# Patient Record
Sex: Female | Born: 1985 | Race: Black or African American | Hispanic: No | Marital: Single | Smoking: Heavy tobacco smoker
Health system: Southern US, Community
[De-identification: ages and names within clinical notes are randomized; demographics above are authoritative.]

## PROBLEM LIST (undated history)

## (undated) DIAGNOSIS — I10 Essential (primary) hypertension: Secondary | ICD-10-CM

---
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
## 2007-01-31 ENCOUNTER — Emergency Department (HOSPITAL_COMMUNITY): Admission: EM | Admit: 2007-01-31 | Discharge: 2007-01-31 | Payer: Self-pay | Admitting: Emergency Medicine

## 2007-02-01 ENCOUNTER — Emergency Department (HOSPITAL_COMMUNITY): Admission: EM | Admit: 2007-02-01 | Discharge: 2007-02-01 | Payer: Self-pay | Admitting: Emergency Medicine
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
## 2011-01-02 ENCOUNTER — Emergency Department (HOSPITAL_COMMUNITY)
Admission: EM | Admit: 2011-01-02 | Discharge: 2011-01-02 | Disposition: A | Discharge status: Home / Self Care | Payer: Self-pay | Attending: Emergency Medicine | Admitting: Emergency Medicine

## 2011-01-02 DIAGNOSIS — H5710 Ocular pain, unspecified eye: Secondary | ICD-10-CM | POA: Insufficient documentation

## 2011-01-02 DIAGNOSIS — H109 Unspecified conjunctivitis: Secondary | ICD-10-CM | POA: Insufficient documentation

## 2013-06-11 ENCOUNTER — Emergency Department (INDEPENDENT_AMBULATORY_CARE_PROVIDER_SITE_OTHER)
Admission: EM | Admit: 2013-06-11 | Discharge: 2013-06-11 | Disposition: A | Discharge status: Home / Self Care | Payer: Self-pay | Source: Home / Self Care | Attending: Family Medicine | Admitting: Family Medicine

## 2013-06-11 ENCOUNTER — Other Ambulatory Visit (HOSPITAL_COMMUNITY)
Admission: RE | Admit: 2013-06-11 | Discharge: 2013-06-11 | Disposition: A | Discharge status: Home / Self Care | Payer: Self-pay | Source: Ambulatory Visit | Attending: Family Medicine | Admitting: Family Medicine

## 2013-06-11 ENCOUNTER — Encounter (HOSPITAL_COMMUNITY): Payer: Self-pay | Admitting: Emergency Medicine

## 2013-06-11 DIAGNOSIS — N760 Acute vaginitis: Secondary | ICD-10-CM | POA: Insufficient documentation

## 2013-06-11 DIAGNOSIS — N898 Other specified noninflammatory disorders of vagina: Secondary | ICD-10-CM

## 2013-06-11 DIAGNOSIS — Z113 Encounter for screening for infections with a predominantly sexual mode of transmission: Secondary | ICD-10-CM | POA: Insufficient documentation

## 2013-06-11 DIAGNOSIS — Z202 Contact with and (suspected) exposure to infections with a predominantly sexual mode of transmission: Secondary | ICD-10-CM

## 2013-06-11 MED ORDER — AZITHROMYCIN 250 MG PO TABS
1000.0000 mg | ORAL_TABLET | Freq: Once | ORAL | Status: AC
  Administered 2013-06-11: 1000 mg via ORAL

## 2013-06-11 MED ORDER — LIDOCAINE HCL (PF) 1 % IJ SOLN
INTRAMUSCULAR | Status: AC
  Filled 2013-06-11: qty 5

## 2013-06-11 MED ORDER — CEFTRIAXONE SODIUM 250 MG IJ SOLR
250.0000 mg | Freq: Once | INTRAMUSCULAR | Status: AC
  Administered 2013-06-11: 250 mg via INTRAMUSCULAR

## 2013-06-11 MED ORDER — CEFTRIAXONE SODIUM 250 MG IJ SOLR
INTRAMUSCULAR | Status: AC
  Filled 2013-06-11: qty 250

## 2013-06-11 MED ORDER — AZITHROMYCIN 250 MG PO TABS
ORAL_TABLET | ORAL | Status: AC
  Filled 2013-06-11: qty 4

## 2013-06-11 NOTE — ED Provider Notes (Signed)
  CSN: [id number]     Arrival date & time 06/11/13  4098 History     First MD Initiated Contact with Patient 06/11/13 1011     Chief Complaint  Patient presents with  . Exposure to STD   (Consider location/radiation/quality/duration/timing/severity/associated sxs/prior Treatment) HPI Comments: 27 year old female here complaining of vaginal discharge and concerns about exposure to sexually transmitted disease. Reports that one of her sexual partners started to develop painful urination and discharge from penis and inform her of that this morning. Patient denies pelvic pain states that she had experienced vaginal discharge that is not different from what is usual for her. Denies dysuria or urinary frequency. No skin rashes. No mucosal ulcers. Patient is otherwise reporting feeling well.   History reviewed. No pertinent past medical history. History reviewed. No pertinent past surgical history. No family history on file. History  Substance Use Topics  . Smoking status: Heavy Tobacco Smoker -- 0.50 packs/day for 5 years    Types: Cigarettes  . Smokeless tobacco: Not on file  . Alcohol Use: No   OB History   Grav Para Term Preterm Abortions TAB SAB Ect Mult Living                 Review of Systems  Constitutional: Negative for fever, chills, appetite change and fatigue.  Gastrointestinal: Negative for nausea, vomiting, abdominal pain and diarrhea.  Genitourinary: Negative for dysuria, frequency, flank pain, vaginal bleeding, vaginal discharge and pelvic pain.  Skin: Negative for rash.  All other systems reviewed and are negative.    Allergies  Review of patient's allergies indicates not on file.  Home Medications  No current outpatient prescriptions on file. BP 141/87  Pulse 77  Temp(Src) 98.6 F (37 C) (Oral)  Resp 16  LMP 05/28/2013 Physical Exam  Nursing note and vitals reviewed. Constitutional: She is oriented to person, place, and time. She appears well-developed and  well-nourished. No distress.  HENT:  Head: Normocephalic and atraumatic.  Mouth/Throat: Oropharynx is clear and moist.  Eyes: No scleral icterus.  Cardiovascular: Normal heart sounds.   Pulmonary/Chest: Breath sounds normal.  Abdominal: Soft. There is no tenderness. There is no rebound and no guarding.  Genitourinary: Uterus normal. Cervix exhibits discharge. Cervix exhibits no motion tenderness and no friability. Right adnexum displays no mass, no tenderness and no fullness. Left adnexum displays no mass, no tenderness and no fullness. Vaginal discharge found.  Lymphadenopathy:    She has no cervical adenopathy.       Right: No inguinal adenopathy present.       Left: No inguinal adenopathy present.  Neurological: She is alert and oriented to person, place, and time.  Skin: No rash noted. She is not diaphoretic.    ED Course   Procedures (including critical care time)  Labs Reviewed  CERVICOVAGINAL ANCILLARY ONLY   No results found. 1. Exposure to STD   2. Vaginal discharge     MDM  Treated with Rocephin IM 250 mg x1 and azithromycin 1 g oral x1. GC Chlamydia and affirm test pending at the time of discharge. Recommended consistent condom use. Supportive care and red flags that should prompt return to medical attention discussed with patient and provided in writing  Sharin Grave, MD 06/12/13 1046

## 2013-06-11 NOTE — ED Notes (Signed)
Pt is here with her sexual partner that has discharge. Pt states she has no sxs, no pain, discharge.

## 2013-06-12 ENCOUNTER — Telehealth (HOSPITAL_COMMUNITY): Payer: Self-pay | Admitting: *Deleted

## 2013-06-12 MED ORDER — FLUCONAZOLE 150 MG PO TABS: ORAL_TABLET | ORAL | Status: DC

## 2013-06-12 MED ORDER — METRONIDAZOLE 500 MG PO TABS: 500.0000 mg | ORAL_TABLET | Freq: Two times a day (BID) | ORAL | Status: DC

## 2013-06-12 NOTE — ED Notes (Signed)
GC and Chlamydia pos., Affirm: Candida and Gardnerella pos., Trich neg.  Orders obtained from Dr. Tressia Danas.   I called pt. Pt. verified x 2 and given results.  Pt. told she was adequately treated for GC and Chlamydia with Rocephin and Zithromax and that she needs Diflucan for Candida and Flagyl for bacterial vaginosis.   Pt. instructed to no alcohol while taking this medication.  Pt. wants Rx. called to [facility] on [address]  Pt. instructed to notify her partner, no sex for 1 week and to practice safe sex. Pt. told she can get HIV testing at the [facility]. STD clinic, by appointment.  Rx. called to pharmacist @ [phone]. Michele Collins 06/12/2013

## 2013-06-13 NOTE — ED Notes (Signed)
[employer] forms x 2 completed and faxed to the [facility]. Michele Collins 06/13/2013

## 2014-05-02 ENCOUNTER — Ambulatory Visit: Payer: Self-pay | Admitting: Obstetrics

## 2022-07-20 ENCOUNTER — Emergency Department (HOSPITAL_COMMUNITY): Payer: Self-pay

## 2022-07-20 ENCOUNTER — Inpatient Hospital Stay (HOSPITAL_COMMUNITY)
Admission: EM | Admit: 2022-07-20 | Discharge: 2022-07-21 | DRG: 189 | Discharge status: Against Medical Advice | Payer: Self-pay | Attending: Internal Medicine | Admitting: Internal Medicine

## 2022-07-20 ENCOUNTER — Other Ambulatory Visit: Payer: Self-pay

## 2022-07-20 ENCOUNTER — Encounter (HOSPITAL_COMMUNITY): Payer: Self-pay

## 2022-07-20 DIAGNOSIS — F17210 Nicotine dependence, cigarettes, uncomplicated: Secondary | ICD-10-CM | POA: Diagnosis present

## 2022-07-20 DIAGNOSIS — J9690 Respiratory failure, unspecified, unspecified whether with hypoxia or hypercapnia: Secondary | ICD-10-CM | POA: Diagnosis present

## 2022-07-20 DIAGNOSIS — F17200 Nicotine dependence, unspecified, uncomplicated: Secondary | ICD-10-CM

## 2022-07-20 DIAGNOSIS — J9601 Acute respiratory failure with hypoxia: Principal | ICD-10-CM | POA: Diagnosis present

## 2022-07-20 DIAGNOSIS — Z1152 Encounter for screening for COVID-19: Secondary | ICD-10-CM

## 2022-07-20 DIAGNOSIS — F1410 Cocaine abuse, uncomplicated: Secondary | ICD-10-CM

## 2022-07-20 DIAGNOSIS — D509 Iron deficiency anemia, unspecified: Secondary | ICD-10-CM

## 2022-07-20 DIAGNOSIS — Z720 Tobacco use: Secondary | ICD-10-CM

## 2022-07-20 DIAGNOSIS — J3089 Other allergic rhinitis: Secondary | ICD-10-CM

## 2022-07-20 DIAGNOSIS — Z603 Acculturation difficulty: Secondary | ICD-10-CM | POA: Diagnosis present

## 2022-07-20 DIAGNOSIS — D649 Anemia, unspecified: Secondary | ICD-10-CM

## 2022-07-20 DIAGNOSIS — Z66 Do not resuscitate: Secondary | ICD-10-CM | POA: Diagnosis present

## 2022-07-20 DIAGNOSIS — Z5329 Procedure and treatment not carried out because of patient's decision for other reasons: Secondary | ICD-10-CM | POA: Diagnosis not present

## 2022-07-20 DIAGNOSIS — R911 Solitary pulmonary nodule: Secondary | ICD-10-CM | POA: Diagnosis present

## 2022-07-20 DIAGNOSIS — J4551 Severe persistent asthma with (acute) exacerbation: Secondary | ICD-10-CM | POA: Diagnosis present

## 2022-07-20 DIAGNOSIS — R0602 Shortness of breath: Secondary | ICD-10-CM

## 2022-07-20 LAB — BASIC METABOLIC PANEL
Anion gap: 8 (ref 5–15)
BUN: 9 mg/dL (ref 6–20)
CO2: 21 mmol/L — ABNORMAL LOW (ref 22–32)
Calcium: 8.9 mg/dL (ref 8.9–10.3)
Chloride: 108 mmol/L (ref 98–111)
Creatinine, Ser: 0.66 mg/dL (ref 0.44–1.00)
GFR, Estimated: >60 mL/min (ref >60)
Glucose, Bld: 93 mg/dL (ref 70–99)
Potassium: 3.5 mmol/L (ref 3.5–5.1)
Sodium: 137 mmol/L (ref 135–145)

## 2022-07-20 LAB — CBC
HCT: 31 % — ABNORMAL LOW (ref 36.0–46.0)
Hemoglobin: 9 g/dL — ABNORMAL LOW (ref 12.0–15.0)
MCH: 22.4 pg — ABNORMAL LOW (ref 26.0–34.0)
MCHC: 29 g/dL — ABNORMAL LOW (ref 30.0–36.0)
MCV: 77.1 fL — ABNORMAL LOW (ref 80.0–100.0)
Platelets: 277 10*3/uL (ref 150–400)
RBC: 4.02 MIL/uL (ref 3.87–5.11)
RDW: 17.5 % — ABNORMAL HIGH (ref 11.5–15.5)
WBC: 7.3 10*3/uL (ref 4.0–10.5)
nRBC: 0 % (ref 0.0–0.2)

## 2022-07-20 LAB — TROPONIN I (HIGH SENSITIVITY)
Troponin I (High Sensitivity): 3 ng/L (ref <18)
Troponin I (High Sensitivity): 2 ng/L (ref <18)

## 2022-07-20 LAB — FOLATE: Folate: 12 ng/mL (ref >5.9)

## 2022-07-20 LAB — RESP PANEL BY RT-PCR (FLU A&B, COVID) ARPGX2
Influenza A by PCR: NEGATIVE
Influenza B by PCR: NEGATIVE
SARS Coronavirus 2 by RT PCR: NEGATIVE

## 2022-07-20 LAB — RETICULOCYTES
Immature Retic Fract: 15.3 % (ref 2.3–15.9)
RBC.: 3.64 MIL/uL — ABNORMAL LOW (ref 3.87–5.11)
Retic Count, Absolute: 33.9 10*3/uL (ref 19.0–186.0)
Retic Ct Pct: 0.9 % (ref 0.4–3.1)

## 2022-07-20 LAB — VITAMIN B12: Vitamin B-12: 223 pg/mL (ref 180–914)

## 2022-07-20 LAB — FERRITIN: Ferritin: 8 ng/mL — ABNORMAL LOW (ref 11–307)

## 2022-07-20 LAB — POC OCCULT BLOOD, ED: Fecal Occult Bld: NEGATIVE

## 2022-07-20 LAB — TSH: TSH: 0.653 u[IU]/mL (ref 0.350–4.500)

## 2022-07-20 LAB — BRAIN NATRIURETIC PEPTIDE: B Natriuretic Peptide: 42 pg/mL (ref 0.0–100.0)

## 2022-07-20 LAB — IRON AND TIBC
Iron: 20 ug/dL — ABNORMAL LOW (ref 28–170)
Saturation Ratios: 4 % — ABNORMAL LOW (ref 10.4–31.8)
TIBC: 461 ug/dL — ABNORMAL HIGH (ref 250–450)
UIBC: 441 ug/dL

## 2022-07-20 LAB — LACTATE DEHYDROGENASE: LDH: 107 U/L (ref 98–192)

## 2022-07-20 LAB — MAGNESIUM: Magnesium: 1.7 mg/dL (ref 1.7–2.4)

## 2022-07-20 LAB — HCG, QUANTITATIVE, PREGNANCY: hCG, Beta Chain, Quant, S: <1 m[IU]/mL (ref <5)

## 2022-07-20 MED ORDER — MAGNESIUM SULFATE 2 GM/50ML IV SOLN
2.0000 g | Freq: Once | INTRAVENOUS | Status: AC
  Administered 2022-07-20: 2 g via INTRAVENOUS
  Filled 2022-07-20: qty 50

## 2022-07-20 MED ORDER — PREDNISONE 20 MG PO TABS: 40.0000 mg | ORAL_TABLET | Freq: Every day | ORAL | Status: DC

## 2022-07-20 MED ORDER — METHYLPREDNISOLONE SODIUM SUCC 125 MG IJ SOLR
125.0000 mg | Freq: Once | INTRAMUSCULAR | Status: AC
  Administered 2022-07-20: 125 mg via INTRAVENOUS
  Filled 2022-07-20: qty 2

## 2022-07-20 MED ORDER — ENOXAPARIN SODIUM 40 MG/0.4ML IJ SOSY
40.0000 mg | PREFILLED_SYRINGE | INTRAMUSCULAR | Status: DC
  Administered 2022-07-21: 40 mg via SUBCUTANEOUS
  Filled 2022-07-20: qty 0.4

## 2022-07-20 MED ORDER — IPRATROPIUM-ALBUTEROL 0.5-2.5 (3) MG/3ML IN SOLN: 3.0000 mL | Freq: Four times a day (QID) | RESPIRATORY_TRACT | Status: DC

## 2022-07-20 MED ORDER — FERROUS FUMARATE 324 (106 FE) MG PO TABS
1.0000 | ORAL_TABLET | Freq: Every day | ORAL | Status: DC
  Administered 2022-07-21: 106 mg via ORAL
  Filled 2022-07-20: qty 1

## 2022-07-20 MED ORDER — ACETAMINOPHEN 650 MG RE SUPP: 650.0000 mg | Freq: Four times a day (QID) | RECTAL | Status: DC | PRN

## 2022-07-20 MED ORDER — METHYLPREDNISOLONE SODIUM SUCC 40 MG IJ SOLR
40.0000 mg | Freq: Two times a day (BID) | INTRAMUSCULAR | Status: AC
  Administered 2022-07-20 – 2022-07-21 (×2): 40 mg via INTRAVENOUS
  Filled 2022-07-20 (×2): qty 1

## 2022-07-20 MED ORDER — IPRATROPIUM-ALBUTEROL 0.5-2.5 (3) MG/3ML IN SOLN
3.0000 mL | Freq: Four times a day (QID) | RESPIRATORY_TRACT | Status: DC
  Administered 2022-07-20 – 2022-07-21 (×4): 3 mL via RESPIRATORY_TRACT
  Filled 2022-07-20 (×4): qty 3

## 2022-07-20 MED ORDER — NICOTINE 14 MG/24HR TD PT24
14.0000 mg | MEDICATED_PATCH | Freq: Every day | TRANSDERMAL | Status: DC
  Administered 2022-07-20 – 2022-07-21 (×2): 14 mg via TRANSDERMAL
  Filled 2022-07-20 (×2): qty 1

## 2022-07-20 MED ORDER — ORAL CARE MOUTH RINSE: 15.0000 mL | OROMUCOSAL | Status: DC | PRN

## 2022-07-20 MED ORDER — CHLORHEXIDINE GLUCONATE CLOTH 2 % EX PADS: 6.0000 | MEDICATED_PAD | Freq: Every day | CUTANEOUS | Status: DC

## 2022-07-20 MED ORDER — GUAIFENESIN ER 600 MG PO TB12
600.0000 mg | ORAL_TABLET | Freq: Two times a day (BID) | ORAL | Status: DC
  Administered 2022-07-20 – 2022-07-21 (×2): 600 mg via ORAL
  Filled 2022-07-20 (×2): qty 1

## 2022-07-20 MED ORDER — SODIUM CHLORIDE (PF) 0.9 % IJ SOLN
INTRAMUSCULAR | Status: AC
  Filled 2022-07-20: qty 50

## 2022-07-20 MED ORDER — IOHEXOL 350 MG/ML SOLN
80.0000 mL | Freq: Once | INTRAVENOUS | Status: AC | PRN
  Administered 2022-07-20: 80 mL via INTRAVENOUS

## 2022-07-20 MED ORDER — ALBUTEROL SULFATE (2.5 MG/3ML) 0.083% IN NEBU
5.0000 mg/h | INHALATION_SOLUTION | Freq: Once | RESPIRATORY_TRACT | Status: AC
  Administered 2022-07-20: 5 mg/h via RESPIRATORY_TRACT
  Filled 2022-07-20: qty 6

## 2022-07-20 MED ORDER — IPRATROPIUM-ALBUTEROL 0.5-2.5 (3) MG/3ML IN SOLN
3.0000 mL | Freq: Once | RESPIRATORY_TRACT | Status: AC
  Administered 2022-07-20: 3 mL via RESPIRATORY_TRACT
  Filled 2022-07-20: qty 9

## 2022-07-20 MED ORDER — ORAL CARE MOUTH RINSE
15.0000 mL | OROMUCOSAL | Status: DC
  Administered 2022-07-21 (×3): 15 mL via OROMUCOSAL

## 2022-07-20 MED ORDER — ACETAMINOPHEN 325 MG PO TABS: 650.0000 mg | ORAL_TABLET | Freq: Four times a day (QID) | ORAL | Status: DC | PRN

## 2022-07-20 MED ORDER — IPRATROPIUM-ALBUTEROL 0.5-2.5 (3) MG/3ML IN SOLN
3.0000 mL | Freq: Once | RESPIRATORY_TRACT | Status: AC
  Administered 2022-07-20: 3 mL via RESPIRATORY_TRACT
  Filled 2022-07-20: qty 3
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
## 2022-07-20 NOTE — ED Notes (Signed)
Pt ambulated to restroom. 

## 2022-07-20 NOTE — ED Provider Notes (Signed)
[facility]-EMERGENCY DEPT Provider Note   CSN: [id number] Arrival date & time: 07/20/22  0133     History  Chief Complaint  Patient presents with   Shortness of Breath    Michele Collins is a 36 y.o. female.  Patient as above with significant medical history as below, including tobacco use, self reported asthma who presents to the ED with complaint of difficulty breathing.  Patient reports feeling unwell over the past week.  Sinus pressure, congestion, postnasal drip, productive cough with white/clear sputum intermittently.  No recent travel or sick contacts per no medications prior to arrival.  Began to have dyspnea this afternoon, progressively worsening.  Difficulty catching her breath. Diff speaking 2/2 dib.  Reports some subjective fevers, no chills.  No history of COPD.  No chest pain.  No recent travel or sick contacts.  No oral hormone use, no history of DVT or PE. Currently menstruating.       History reviewed. No pertinent past medical history.  History reviewed. No pertinent surgical history.   The history is provided by the patient. A language interpreter was used.  Shortness of Breath Associated symptoms: cough        Home Medications Prior to Admission medications   Medication Sig Start Date End Date Taking? Authorizing Provider  fluconazole (DIFLUCAN) 150 MG tablet 1 tab po x1 06/12/13   Moreno-Coll, Adlih, MD  metroNIDAZOLE (FLAGYL) 500 MG tablet Take 1 tablet (500 mg total) by mouth 2 (two) times daily. 06/12/13   Moreno-Coll, Adlih, MD      Allergies    Patient has no allergy information on record.    Review of Systems   Review of Systems  Unable to perform ROS: Severe respiratory distress  Constitutional:  Positive for fatigue.  HENT:  Positive for congestion, postnasal drip, rhinorrhea and sinus pressure.   Respiratory:  Positive for cough and shortness of breath.     Physical Exam Updated Vital Signs BP (!) 141/79   Pulse  (!) 104   Temp 98.9 F (37.2 C) (Oral)   Resp (!) 25   Ht 5\' 8"  (1.727 m)   Wt 72.6 kg   LMP  (LMP Unknown) Comment: negative beta HCG 07/20/22  SpO2 98%   BMI 24.33 kg/m  Physical Exam Vitals and nursing note reviewed.  Constitutional:      General: She is in acute distress.     Appearance: Normal appearance. She is well-developed. She is diaphoretic.  HENT:     Head: Normocephalic and atraumatic.     Jaw: There is normal jaw occlusion.     Right Ear: External ear normal.     Left Ear: External ear normal.     Nose: Nose normal.     Mouth/Throat:     Mouth: Mucous membranes are moist.     Pharynx: Uvula midline.  Eyes:     General: No scleral icterus.       Right eye: No discharge.        Left eye: No discharge.  Cardiovascular:     Rate and Rhythm: Normal rate and regular rhythm.     Pulses: Normal pulses.     Heart sounds: Normal heart sounds.  Pulmonary:     Effort: Tachypnea, accessory muscle usage and respiratory distress present.     Breath sounds: Decreased air movement present. Decreased breath sounds and wheezing present.  Abdominal:     General: Abdomen is flat.     Tenderness: There is  no abdominal tenderness.  Musculoskeletal:        General: Normal range of motion.     Cervical back: Normal range of motion.     Right lower leg: No edema.     Left lower leg: No edema.  Skin:    General: Skin is warm.     Capillary Refill: Capillary refill takes less than 2 seconds.  Neurological:     Mental Status: She is alert.  Psychiatric:        Mood and Affect: Mood normal.        Behavior: Behavior normal.     ED Results / Procedures / Treatments   Labs (all labs ordered are listed, but only abnormal results are displayed) Labs Reviewed  CBC - Abnormal; Notable for the following components:      Result Value   Hemoglobin 9.0 (*)    HCT 31.0 (*)    MCV 77.1 (*)    MCH 22.4 (*)    MCHC 29.0 (*)    RDW 17.5 (*)    All other components within normal  limits  BASIC METABOLIC PANEL - Abnormal; Notable for the following components:   CO2 21 (*)    All other components within normal limits  RETICULOCYTES - Abnormal; Notable for the following components:   RBC. 3.64 (*)    All other components within normal limits  RESP PANEL BY RT-PCR (FLU A&B, COVID) ARPGX2  MAGNESIUM  HCG, QUANTITATIVE, PREGNANCY  BRAIN NATRIURETIC PEPTIDE  LACTATE DEHYDROGENASE  OCCULT BLOOD X 1 CARD TO LAB, STOOL  TSH  VITAMIN B12  FOLATE  IRON AND TIBC  FERRITIN  POC OCCULT BLOOD, ED  TROPONIN I (HIGH SENSITIVITY)  TROPONIN I (HIGH SENSITIVITY)    EKG EKG Interpretation  Date/Time:  Tuesday July 20 2022 01:37:55 EDT Ventricular Rate:  105 PR Interval:  174 QRS Duration: 93 QT Interval:  347 QTC Calculation: 459 R Axis:   68 Text Interpretation: Sinus tachycardia Consider right atrial enlargement No old tracing to compare no stemi Interpretation limited secondary to artifact Confirmed by Tanda Rockers ([id number]) on 07/20/2022 6:55:20 AM  Radiology CT Angio Chest PE W and/or Wo Contrast  Result Date: 07/20/2022 CLINICAL DATA:  36 year old female with history of shortness of breath for the past 2 days. Evaluate for pulmonary embolism. EXAM: CT ANGIOGRAPHY CHEST WITH CONTRAST TECHNIQUE: Multidetector CT imaging of the chest was performed using the standard protocol during bolus administration of intravenous contrast. Multiplanar CT image reconstructions and MIPs were obtained to evaluate the vascular anatomy. RADIATION DOSE REDUCTION: This exam was performed according to the departmental dose-optimization program which includes automated exposure control, adjustment of the mA and/or kV according to patient size and/or use of iterative reconstruction technique. CONTRAST:  26mL OMNIPAQUE IOHEXOL 350 MG/ML SOLN COMPARISON:  No priors. FINDINGS: Comment: Today's study is limited by considerable patient respiratory motion. Cardiovascular: No central or lobar filling  defects are noted to suggest large pulmonary embolism. Smaller segmental and subsegmental sized emboli can not be entirely excluded on the basis of today's examination which is severely limited by a large amount of patient respiratory motion. Heart size is normal. There is no significant pericardial fluid, thickening or pericardial calcification. No atherosclerotic calcifications are noted in the thoracic aorta or the coronary arteries. Mediastinum/Nodes: No pathologically enlarged mediastinal or hilar lymph nodes. Esophagus is unremarkable in appearance. No axillary lymphadenopathy. Lungs/Pleura: Areas of mild subsegmental atelectasis or scarring are noted in the lung bases bilaterally, most severe in the right  middle lobe. No acute consolidative airspace disease. No pleural effusions. Tiny 3 mm pulmonary nodule in the periphery of the right middle lobe (axial image 85 of series 6). No other larger more suspicious appearing pulmonary nodules or masses are noted. Upper Abdomen: Unremarkable. Musculoskeletal: There are no aggressive appearing lytic or blastic lesions noted in the visualized portions of the skeleton. Review of the MIP images confirms the above findings. IMPRESSION: 1. Severely limited examination secondary to extensive patient respiratory motion. With these limitations in mind there is no central or lobar sized pulmonary embolism. Segmental and subsegmental sized emboli are not excluded on the basis of today's examination. 2. 3 mm right middle lobe pulmonary nodule. This is nonspecific, but statistically likely benign. No follow-up needed if patient is low-risk.This recommendation follows the consensus statement: Guidelines for Management of Incidental Pulmonary Nodules Detected on CT Images: From the Fleischner Society 2017; Radiology 2017; 284:228-243. Electronically Signed   By: Trudie Reed M.D.   On: 07/20/2022 05:11   DG Chest Portable 1 View  Result Date: 07/20/2022 CLINICAL DATA:   Increasing shortness of breath over the past 2 days EXAM: PORTABLE CHEST 1 VIEW COMPARISON:  01/31/2007 FINDINGS: The heart size and mediastinal contours are within normal limits. Both lungs are clear. The visualized skeletal structures are unremarkable. IMPRESSION: No active disease. Electronically Signed   By: Alcide Clever M.D.   On: 07/20/2022 02:24    Procedures .Critical Care  Performed by: Sloan Leiter, DO Authorized by: Sloan Leiter, DO   Critical care provider statement:    Critical care time (minutes):  49   Critical care time was exclusive of:  Separately billable procedures and treating other patients   Critical care was necessary to treat or prevent imminent or life-threatening deterioration of the following conditions:  Respiratory failure   Critical care was time spent personally by me on the following activities:  Development of treatment plan with patient or surrogate, discussions with consultants, evaluation of patient's response to treatment, examination of patient, ordering and review of laboratory studies, ordering and review of radiographic studies, ordering and performing treatments and interventions, pulse oximetry, re-evaluation of patient's condition, review of old charts and obtaining history from patient or surrogate   Care discussed with: admitting provider       Medications Ordered in ED Medications  sodium chloride (PF) 0.9 % injection (has no administration in time range)  ipratropium-albuterol (DUONEB) 0.5-2.5 (3) MG/3ML nebulizer solution 3 mL (3 mLs Nebulization Given 07/20/22 0142)  methylPREDNISolone sodium succinate (SOLU-MEDROL) 125 mg/2 mL injection 125 mg (125 mg Intravenous Given 07/20/22 0143)  ipratropium-albuterol (DUONEB) 0.5-2.5 (3) MG/3ML nebulizer solution 3 mL (3 mLs Nebulization Given 07/20/22 0219)  magnesium sulfate IVPB 2 g 50 mL (0 g Intravenous Stopped 07/20/22 0321)  albuterol (PROVENTIL) (2.5 MG/3ML) 0.083% nebulizer solution (5 mg/hr  Nebulization Given 07/20/22 0305)  iohexol (OMNIPAQUE) 350 MG/ML injection 80 mL (80 mLs Intravenous Contrast Given 07/20/22 0454)    ED Course/ Medical Decision Making/ A&P Clinical Course as of 07/20/22 0700  Tue Jul 20, 2022  0527 Patient seems to have ongoing dyspnea, tachypnea despite multiple nebulized breathing treatments, Solu-Medrol, mag sulfate, 1 hour Treatment.  At this time recommend admission for asthma exacerbation.  Patient agreeable. [SG]    Clinical Course User Index [SG] Sloan Leiter, DO                           Medical Decision Making  Amount and/or Complexity of Data Reviewed Labs: ordered. Radiology: ordered.  Risk Prescription drug management. Decision regarding hospitalization.   This patient presents to the ED with chief complaint(s) of dib with pertinent past medical history of above which further complicates the presenting complaint. The complaint involves an extensive differential diagnosis and also carries with it a high risk of complications and morbidity.    In my evaluation of this patient's dyspnea my DDx includes, but is not limited to, pneumonia, pulmonary embolism, pneumothorax, pulmonary edema, metabolic acidosis, asthma, COPD, cardiac cause, anemia, anxiety, etc.   . Serious etiologies were considered.   The initial plan is to give duoneb, screening labs, xr solumedrol mag   Additional history obtained: Additional history obtained from  na Records reviewed  prior ED visits, prior labs and imaging  Independent labs interpretation:  The following labs were independently interpreted:  Labs reviewed, hemoglobin 9.0.  She has no baseline available.  Patient denies any history of prior low blood counts but does report she has had pica for the past few years.  Was told the past that she should take iron supplements but never started.  She is also currently menstruating, feels her period is typical, usually very light.  Occult negative.  No  melena Trop negative, BNP not elevated, RVP negative  Independent visualization of imaging: - I independently visualized the following imaging with scope of interpretation limited to determining acute life threatening conditions related to emergency care: Chest x-ray, which revealed no acute process.  Ongoing dyspnea, obtain CT PE, no evidence of PE but severely motion degraded  Cardiac monitoring was reviewed and interpreted by myself which shows SR  Treatment and Reassessment: DuoNeb, Solu-Medrol, mag sulfate >> Mild improvement in respiratory status Repeated DuoNeb >> Continue to improve, still having conversational dyspnea Give CAT 5mg  >> modest improvement, ongoing conversational dyspnea   Consultation: - Consulted or discussed management/test interpretation w/ external professional: na  Consideration for admission or further workup: Admission was considered   36 year old female with history as above including daily tobacco use, asthma history per the patient the ED secondary to URI symptoms, dyspnea.  Patient conversational dyspnea on arrival, respiratory distress.  Diffuse wheezing.  Accessory muscle use.  Labs reviewed, she has anemia 9.0 hemoglobin, microcytic.  No baseline able.  Send anemia panel.  Hemoccult was negative.  He denies melena.  She just started menstruating in the last 48 hours, menses are light.  No other obvious sources of bleeding.  No history of colonoscopy. Send anemia panel.   Ongoing respiratory difficulty despite multiple nebulized breathing treatments, ongoing wheezing which has mildly improved.  Conversational dyspnea ongoing.  Requiring supp oxygen 2 L nasal cannula.  Recommend admission.  She is agreeable. Spoke with Dr Bridgett Larsson who accepts pt for admit.         Social Determinants of health: Counseled patient for approximately 3 minutes regarding smoking cessation. Discussed risks of smoking and how they applied and affected their visit here today.  Patient not ready to quit at this time, however will follow up with their primary doctor when they are.   CPT code: [phone]: intermediate counseling for smoking cessation   Social History   Tobacco Use   Smoking status: Heavy Smoker    Packs/day: 0.50    Years: 5.00    Total pack years: 2.50    Types: Cigarettes  Substance Use Topics   Alcohol use: No            Final Clinical Impression(s) /  ED Diagnoses Final diagnoses:  Severe persistent asthma with acute exacerbation  Anemia, unspecified type    Rx / DC Orders ED Discharge Orders     None         Sloan Leiter, DO 07/20/22 0700

## 2022-07-20 NOTE — ED Notes (Signed)
Pt resting in stretcher pt has increased work of breathing but maintaining o2 saturation above 90. Pt able to eat 50% of meal.

## 2022-07-20 NOTE — ED Notes (Signed)
Respiratory paged for cont. Neb application.

## 2022-07-20 NOTE — H&P (Signed)
History and Physical    Patient: Michele Collins VOZ:[id number] DOB: [birth year] DOA: 07/20/2022 DOS: the patient was seen and examined on 07/20/2022 PCP: Patient, No Pcp Per  Patient coming from: Home  Chief Complaint:  Chief Complaint  Patient presents with   Shortness of Breath   HPI: Michele MULLANE is a 36 y.o. female with medical history significant of tobacco abuse, cocaine abuse. Presenting with shortness of breath. Her symptoms started 3 days ago. She's had a non-productive cough. No fever, N/V/D, or sick contacts. She didn't try any medicines to help. She endorses ongoing tobacco and crack cocaine use. When her symptoms did not improve last night, she decided to come to the ED for assistance.    Review of Systems: As mentioned in the history of present illness. All other systems reviewed and are negative.  PMHx History reviewed. No pertinent past medical history.  PSHx History reviewed. No pertinent surgical history.  Social History:  reports that she has been smoking cigarettes. She has a 2.50 pack-year smoking history. She does not have any smokeless tobacco history on file. She reports that she does not drink alcohol. +Smoking crack cocaine 3x week.   No Known Allergies  FamHx History reviewed. No pertinent family history.  Prior to Admission medications   Medication Sig Start Date End Date Taking? Authorizing Provider  fluconazole (DIFLUCAN) 150 MG tablet 1 tab po x1 Patient not taking: Reported on 07/20/2022 06/12/13   Moreno-Coll, Adlih, MD  metroNIDAZOLE (FLAGYL) 500 MG tablet Take 1 tablet (500 mg total) by mouth 2 (two) times daily. Patient not taking: Reported on 07/20/2022 06/12/13   Randa Spike, MD    Physical Exam: Vitals:   07/20/22 0530 07/20/22 0600 07/20/22 0615 07/20/22 0700  BP: (!) 140/96 (!) 141/79  139/85  Pulse: (!) 116 (!) 104  (!) 119  Resp:  (!) 25  20  Temp:   98.9 F (37.2 C)   TempSrc:   Oral   SpO2: 94% 98%  98%  Weight:       Height:       General: 36 y.o. female resting in bed in NAD Eyes: PERRL, normal sclera ENMT: Nares patent w/o discharge, orophaynx clear, dentition normal, ears w/o discharge/lesions/ulcers Neck: Supple, trachea midline Cardiovascular: tachy, +S1, S2, no m/g/r, equal pulses throughout Respiratory: diffuse wheeze, course, increased WOB, but she is moving good air GI: BS+, NDNT, no masses noted, no organomegaly noted MSK: No e/c/c Neuro: A&O x 3, no focal deficits Psyc: Appropriate interaction and affect, calm/cooperative  Data Reviewed:  Results for orders placed or performed during the hospital encounter of 07/20/22 (from the past 24 hour(s))  CBC     Status: Abnormal   Collection Time: 07/20/22  1:35 AM  Result Value Ref Range   WBC 7.3 4.0 - 10.5 K/uL   RBC 4.02 3.87 - 5.11 MIL/uL   Hemoglobin 9.0 (L) 12.0 - 15.0 g/dL   HCT 31.0 (L) 36.0 - 46.0 %   MCV 77.1 (L) 80.0 - 100.0 fL   MCH 22.4 (L) 26.0 - 34.0 pg   MCHC 29.0 (L) 30.0 - 36.0 g/dL   RDW 17.5 (H) 11.5 - 15.5 %   Platelets 277 150 - 400 K/uL   nRBC 0.0 0.0 - 0.2 %  Basic metabolic panel     Status: Abnormal   Collection Time: 07/20/22  1:35 AM  Result Value Ref Range   Sodium 137 135 - 145 mmol/L   Potassium 3.5 3.5 - 5.1  mmol/L   Chloride 108 98 - 111 mmol/L   CO2 21 (L) 22 - 32 mmol/L   Glucose, Bld 93 70 - 99 mg/dL   BUN 9 6 - 20 mg/dL   Creatinine, Ser 4.16 0.44 - 1.00 mg/dL   Calcium 8.9 8.9 - 60.6 mg/dL   GFR, Estimated >30 >16 mL/min   Anion gap 8 5 - 15  Magnesium     Status: None   Collection Time: 07/20/22  1:35 AM  Result Value Ref Range   Magnesium 1.7 1.7 - 2.4 mg/dL  Brain natriuretic peptide     Status: None   Collection Time: 07/20/22  1:35 AM  Result Value Ref Range   B Natriuretic Peptide 42.0 0.0 - 100.0 pg/mL  Troponin I (High Sensitivity)     Status: None   Collection Time: 07/20/22  1:35 AM  Result Value Ref Range   Troponin I (High Sensitivity) 3 <18 ng/L  Resp Panel by RT-PCR  (Flu A&B, Covid) Anterior Nasal Swab     Status: None   Collection Time: 07/20/22  1:37 AM   Specimen: Anterior Nasal Swab  Result Value Ref Range   SARS Coronavirus 2 by RT PCR NEGATIVE NEGATIVE   Influenza A by PCR NEGATIVE NEGATIVE   Influenza B by PCR NEGATIVE NEGATIVE  hCG, quantitative, pregnancy     Status: None   Collection Time: 07/20/22  1:41 AM  Result Value Ref Range   hCG, Beta Chain, Quant, S <1 <5 mIU/mL  Troponin I (High Sensitivity)     Status: None   Collection Time: 07/20/22  4:46 AM  Result Value Ref Range   Troponin I (High Sensitivity) 2 <18 ng/L  POC occult blood, ED     Status: None   Collection Time: 07/20/22  5:40 AM  Result Value Ref Range   Fecal Occult Bld NEGATIVE NEGATIVE  Lactate dehydrogenase     Status: None   Collection Time: 07/20/22  5:44 AM  Result Value Ref Range   LDH 107 98 - 192 U/L  TSH     Status: None   Collection Time: 07/20/22  5:44 AM  Result Value Ref Range   TSH 0.653 0.350 - 4.500 uIU/mL  Vitamin B12     Status: None   Collection Time: 07/20/22  5:44 AM  Result Value Ref Range   Vitamin B-12 223 180 - 914 pg/mL  Folate     Status: None   Collection Time: 07/20/22  5:44 AM  Result Value Ref Range   Folate 12.0 >5.9 ng/mL  Iron and TIBC     Status: Abnormal   Collection Time: 07/20/22  5:44 AM  Result Value Ref Range   Iron 20 (L) 28 - 170 ug/dL   TIBC 010 (H) 932 - 355 ug/dL   Saturation Ratios 4 (L) 10.4 - 31.8 %   UIBC 441 ug/dL  Ferritin     Status: Abnormal   Collection Time: 07/20/22  5:44 AM  Result Value Ref Range   Ferritin 8 (L) 11 - 307 ng/mL  Reticulocytes     Status: Abnormal   Collection Time: 07/20/22  5:44 AM  Result Value Ref Range   Retic Ct Pct 0.9 0.4 - 3.1 %   RBC. 3.64 (L) 3.87 - 5.11 MIL/uL   Retic Count, Absolute 33.9 19.0 - 186.0 K/uL   Immature Retic Fract 15.3 2.3 - 15.9 %    CXR: No active disease.  CTA Chest 1. Severely limited examination secondary  to extensive patient respiratory  motion. With these limitations in mind there is no central or lobar sized pulmonary embolism. Segmental and subsegmental sized emboli are not excluded on the basis of today's examination. 2. 3 mm right middle lobe pulmonary nodule. This is nonspecific, but statistically likely benign. No follow-up needed if patient is low-risk.This recommendation follows the consensus statement: Guidelines for Management of Incidental Pulmonary Nodules Detected on CT Images: From the Fleischner Society 2017; Radiology 2017; 284:228-243.  Assessment and Plan: Dyspnea     - placed in obs, SDU     - continue steroids, nebs; add guaifenesin     - wean O2 as able     - CT negative for PE, PNA     - she is COVID/flu negative  Iron-deficiency anemia     - iron supplementation  Tobacco abuse Cocaine abuse     - counsel against further use  Advance Care Planning:   Code Status: DNR  Consults: None  Family Communication: None at bedside  Severity of Illness: The appropriate patient status for this patient is OBSERVATION. Observation status is judged to be reasonable and necessary in order to provide the required intensity of service to ensure the patient's safety. The patient's presenting symptoms, physical exam findings, and initial radiographic and laboratory data in the context of their medical condition is felt to place them at decreased risk for further clinical deterioration. Furthermore, it is anticipated that the patient will be medically stable for discharge from the hospital within 2 midnights of admission.   Time spent in coordination of this H&P: 70 minutes  Author: Teddy Spike, DO 07/20/2022 7:54 AM  For on call review [email or web address].

## 2022-07-21 ENCOUNTER — Inpatient Hospital Stay (HOSPITAL_COMMUNITY): Payer: Self-pay

## 2022-07-21 DIAGNOSIS — J3089 Other allergic rhinitis: Secondary | ICD-10-CM

## 2022-07-21 DIAGNOSIS — R0609 Other forms of dyspnea: Secondary | ICD-10-CM

## 2022-07-21 DIAGNOSIS — J9690 Respiratory failure, unspecified, unspecified whether with hypoxia or hypercapnia: Secondary | ICD-10-CM | POA: Diagnosis present

## 2022-07-21 LAB — COMPREHENSIVE METABOLIC PANEL
ALT: 13 U/L (ref 0–44)
AST: 13 U/L — ABNORMAL LOW (ref 15–41)
Albumin: 3.3 g/dL — ABNORMAL LOW (ref 3.5–5.0)
Alkaline Phosphatase: 36 U/L — ABNORMAL LOW (ref 38–126)
Anion gap: 6 (ref 5–15)
BUN: 13 mg/dL (ref 6–20)
CO2: 22 mmol/L (ref 22–32)
Calcium: 9 mg/dL (ref 8.9–10.3)
Chloride: 110 mmol/L (ref 98–111)
Creatinine, Ser: 0.61 mg/dL (ref 0.44–1.00)
GFR, Estimated: >60 mL/min (ref >60)
Glucose, Bld: 102 mg/dL — ABNORMAL HIGH (ref 70–99)
Potassium: 3.9 mmol/L (ref 3.5–5.1)
Sodium: 138 mmol/L (ref 135–145)
Total Bilirubin: 0.4 mg/dL (ref 0.3–1.2)
Total Protein: 7.2 g/dL (ref 6.5–8.1)

## 2022-07-21 LAB — CBC
HCT: 26.8 % — ABNORMAL LOW (ref 36.0–46.0)
Hemoglobin: 7.7 g/dL — ABNORMAL LOW (ref 12.0–15.0)
MCH: 22.1 pg — ABNORMAL LOW (ref 26.0–34.0)
MCHC: 28.7 g/dL — ABNORMAL LOW (ref 30.0–36.0)
MCV: 76.8 fL — ABNORMAL LOW (ref 80.0–100.0)
Platelets: 248 10*3/uL (ref 150–400)
RBC: 3.49 MIL/uL — ABNORMAL LOW (ref 3.87–5.11)
RDW: 17.8 % — ABNORMAL HIGH (ref 11.5–15.5)
WBC: 7.5 10*3/uL (ref 4.0–10.5)
nRBC: 0 % (ref 0.0–0.2)

## 2022-07-21 LAB — RESPIRATORY PANEL BY PCR

## 2022-07-21 LAB — HIV ANTIBODY (ROUTINE TESTING W REFLEX): HIV Screen 4th Generation wRfx: NONREACTIVE

## 2022-07-21 LAB — ECHOCARDIOGRAM COMPLETE
Area-P 1/2: 3.34 cm2
Height: 68 in
S' Lateral: 3.5 cm
Weight: 2560 oz

## 2022-07-21 LAB — MRSA NEXT GEN BY PCR, NASAL: MRSA by PCR Next Gen: NOT DETECTED

## 2022-07-21 MED ORDER — MAGNESIUM SULFATE 2 GM/50ML IV SOLN
2.0000 g | Freq: Once | INTRAVENOUS | Status: AC
  Administered 2022-07-21: 2 g via INTRAVENOUS
  Filled 2022-07-21: qty 50

## 2022-07-21 MED ORDER — METHYLPREDNISOLONE SODIUM SUCC 40 MG IJ SOLR
40.0000 mg | Freq: Two times a day (BID) | INTRAMUSCULAR | Status: DC
  Administered 2022-07-21: 40 mg via INTRAVENOUS
  Filled 2022-07-21: qty 1

## 2022-07-21 MED ORDER — STERILE WATER FOR INJECTION IJ SOLN
INTRAMUSCULAR | Status: AC
  Administered 2022-07-21: 1 mL
  Filled 2022-07-21: qty 10

## 2022-07-21 MED ORDER — BUDESONIDE 0.25 MG/2ML IN SUSP
0.2500 mg | Freq: Two times a day (BID) | RESPIRATORY_TRACT | Status: DC
  Administered 2022-07-21: 0.25 mg via RESPIRATORY_TRACT
  Filled 2022-07-21: qty 2

## 2022-07-21 MED ORDER — MONTELUKAST SODIUM 10 MG PO TABS: 10.0000 mg | ORAL_TABLET | Freq: Every day | ORAL | Status: DC

## 2022-07-21 MED ORDER — MAGNESIUM OXIDE -MG SUPPLEMENT 400 (240 MG) MG PO TABS: 400.0000 mg | ORAL_TABLET | Freq: Two times a day (BID) | ORAL | Status: DC

## 2022-07-21 MED ORDER — VITAMIN B-12 1000 MCG PO TABS
500.0000 ug | ORAL_TABLET | Freq: Every day | ORAL | Status: DC
  Administered 2022-07-21: 500 ug via ORAL
  Filled 2022-07-21: qty 1

## 2022-07-21 MED ORDER — ALBUTEROL SULFATE (2.5 MG/3ML) 0.083% IN NEBU: 2.5000 mg | INHALATION_SOLUTION | RESPIRATORY_TRACT | Status: DC | PRN

## 2022-07-21 NOTE — Hospital Course (Addendum)
36 y.o. female with medical history significant of tobacco abuse, cocaine abuse presented with shortness of breath x 3 days, had nonproductive cough no fever nausea vomiting or sick contacts.  In the ED chest x-ray severely limited due to extensive respiratory motion, 3 mm right middle lobe pulmonary nodule, CTA limited no central or lobar/pulmonary embolism 3 mm right middle lobe pulmonary nodule nonspecific, labs with hemoglobin 9.0 MCV 77 stable renal function magnesium 1.7, COVID-19 flu negative BNP negative troponin negative x2 fecal occult blood negative TSH 0.6 B12 223 folate 12 ferritin 8.

## 2022-07-21 NOTE — Progress Notes (Signed)
  Echocardiogram 2D Echocardiogram has been performed.  [name] 07/21/2022, 3:47 PM

## 2022-07-21 NOTE — Progress Notes (Signed)
RN notified by [facility] that pt's heart rate was sustaining in the 150's. When I entered the room to assess the patient, she stated that she wanted to leave AMA. I advised against leaving AMA and notified the MD who also advised against leaving. The patient removed her right AC PIV, got dressed and proceeded to leave. She was A&Ox4, ambulatory, sinus tach and sating 95% on room air. She signed her AMA paperwork and walked off the unit with family.

## 2022-07-21 NOTE — Progress Notes (Signed)
PROGRESS NOTE Michele Collins  [id number] DOB: 1986/04/14 DOA: 07/20/2022 PCP: Patient, No Pcp Per   Brief Narrative/Hospital Course: 36 y.o. female with medical history significant of tobacco abuse, cocaine abuse presented with shortness of breath x 3 days, had nonproductive cough no fever nausea vomiting or sick contacts.  In the ED chest x-ray severely limited due to extensive respiratory motion, 3 mm right middle lobe pulmonary nodule, CTA limited no central or lobar/pulmonary embolism 3 mm right middle lobe pulmonary nodule nonspecific, labs with hemoglobin 9.0 MCV 77 stable renal function magnesium 1.7, COVID-19 flu negative BNP negative troponin negative x2 fecal occult blood negative TSH 0.6 B12 223 folate 12 ferritin 8.  Subjective: Seen this am C/o difficulty taking deep breath Having like this for about a month C/o chest pain midsternal PTA but only with cough that is dry- now c/o tightness  No fever chills, on weight loss  Assessment and Plan: Principal Problem:   Shortness of breath Active Problems:   Iron deficiency anemia   Tobacco abuse   Cocaine abuse (HCC)  Dyspnea/shortness of breath/wheezing for a month Acute hypoxic respiratory failure w/ respiratory distress: Work-up unremarkable so far w/ COVID flu, CT angio.  Troponin negative x2 BNP normal.  Unclear etiology suspect in setting of cocaine abuse tobacco abuse, reactive airway- as she has b/l diminished breath with wheezing complicated by anemia. Cont Solu-Medrol, nebs,will get TTE CheCk RVP panel, consulted PCCM. No known hx of Asthma.  Microcytic anemia with iron deficiency: B12 on lower side.  Folate normal.  Will benefit with iron supplementation B12 supplementation.  Hemoglobin downtrended if further drops will benefit with transfusion especially with shortness of breath. Reports she has regular mentrual periods- active now. Recent Labs  Lab 07/20/22 0135 07/21/22 0255  HGB 9.0* 7.7*  HCT 31.0* 26.8*     Hypomagnesemia-repleted. Tobacco abuse/Cocaine abuse: cessation advised.  DVT prophylaxis: enoxaparin (LOVENOX) injection 40 mg Start: 07/21/22 1000 Code Status:   Code Status: DNR Family Communication: plan of care discussed with patient at bedside. Patient status is: observation, remains hospitalized due to hypoxia-continue to monitor in stepdown.  At risk of decompensation.  Level of care: Stepdown   Dispo: The patient is from: home with roommate            Anticipated disposition: TBD Objective: Vitals last 24 hrs: Vitals:   07/21/22 0500 07/21/22 0700 07/21/22 0800 07/21/22 0816  BP: 132/81  (!) 163/98   Pulse:   (!) 115   Resp: (!) 28  (!) 28   Temp:  97.8 F (36.6 C)    TempSrc:  Axillary    SpO2:   95% 100%  Weight:      Height:       Weight change:   Physical Examination: General exam: alert awake,older than stated age, weak appearing. HEENT:Oral mucosa moist, Ear/Nose WNL grossly, dentition normal. Respiratory system: bilaterally diminished BS DIFFUSELY, wheezing+ diffusely,no use of accessory muscle Cardiovascular system: S1 & S2 +, No JVD. Gastrointestinal system: Abdomen soft,NT,ND, BS+ Nervous System:Alert, awake, moving extremities and grossly nonfocal Extremities: LE edema NEG,distal peripheral pulses palpable.  Skin: No rashes,no icterus. MSK: Normal muscle bulk,tone, power  Medications reviewed:  Scheduled Meds:  Chlorhexidine Gluconate Cloth  6 each Topical Daily   vitamin B-12  500 mcg Oral Daily   enoxaparin (LOVENOX) injection  40 mg Subcutaneous Q24H   Ferrous Fumarate  1 tablet Oral Daily   guaiFENesin  600 mg Oral BID   ipratropium-albuterol  3 mL Nebulization  Q6H   magnesium oxide  400 mg Oral BID   nicotine  14 mg Transdermal Daily   mouth rinse  15 mL Mouth Rinse 4 times per day   predniSONE  40 mg Oral Q breakfast   Continuous Infusions:    Diet Order             Diet regular Room service appropriate? Yes; Fluid consistency:  Thin  Diet effective now                  Intake/Output Summary (Last 24 hours) at 07/21/2022 0932 Last data filed at 07/21/2022 0800 Gross per 24 hour  Intake --  Output 500 ml  Net -500 ml   Net IO Since Admission: -500 mL [07/21/22 0932]  Wt Readings from Last 3 Encounters:  07/20/22 72.6 kg     Unresulted Labs (From admission, onward)     Start     Ordered   07/20/22 2314  HIV Antibody (routine testing w rflx)  (HIV Antibody (Routine testing w reflex) panel)  Once,   R        07/20/22 2313   07/20/22 0538  Occult blood card to lab, stool  Once,   URGENT        07/20/22 0537          Data Reviewed: I have personally reviewed following labs and imaging studies CBC: Recent Labs  Lab 07/20/22 0135 07/21/22 0255  WBC 7.3 7.5  HGB 9.0* 7.7*  HCT 31.0* 26.8*  MCV 77.1* 76.8*  PLT 277 [id number]   Basic Metabolic Panel: Recent Labs  Lab 07/20/22 0135 07/21/22 0255  NA 137 138  K 3.5 3.9  CL 108 110  CO2 21* 22  GLUCOSE 93 102*  BUN 9 13  CREATININE 0.66 0.61  CALCIUM 8.9 9.0  MG 1.7  --    GFR: Estimated Creatinine Clearance: 99 mL/min (by C-G formula based on SCr of 0.61 mg/dL). Liver Function Tests: Recent Labs  Lab 07/21/22 0255  AST 13*  ALT 13  ALKPHOS 36*  BILITOT 0.4  PROT 7.2  ALBUMIN 3.3*   Thyroid Function Tests: Recent Labs    07/20/22 0544  TSH 0.653   Sepsis Labs: No results for input(s): "PROCALCITON", "LATICACIDVEN" in the last 168 hours.  Antimicrobials: Anti-infectives (From admission, onward)    None      Culture/Microbiology No results found for: "SDES", "SPECREQUEST", "CULT", "REPTSTATUS"  Other culture-see note  Radiology Studies: CT Angio Chest PE W and/or Wo Contrast  Result Date: 07/20/2022 CLINICAL DATA:  36 year old female with history of shortness of breath for the past 2 days. Evaluate for pulmonary embolism. EXAM: CT ANGIOGRAPHY CHEST WITH CONTRAST TECHNIQUE: Multidetector CT imaging of the chest was  performed using the standard protocol during bolus administration of intravenous contrast. Multiplanar CT image reconstructions and MIPs were obtained to evaluate the vascular anatomy. RADIATION DOSE REDUCTION: This exam was performed according to the departmental dose-optimization program which includes automated exposure control, adjustment of the mA and/or kV according to patient size and/or use of iterative reconstruction technique. CONTRAST:  37mL OMNIPAQUE IOHEXOL 350 MG/ML SOLN COMPARISON:  No priors. FINDINGS: Comment: Today's study is limited by considerable patient respiratory motion. Cardiovascular: No central or lobar filling defects are noted to suggest large pulmonary embolism. Smaller segmental and subsegmental sized emboli can not be entirely excluded on the basis of today's examination which is severely limited by a large amount of patient respiratory motion. Heart size is normal. There  is no significant pericardial fluid, thickening or pericardial calcification. No atherosclerotic calcifications are noted in the thoracic aorta or the coronary arteries. Mediastinum/Nodes: No pathologically enlarged mediastinal or hilar lymph nodes. Esophagus is unremarkable in appearance. No axillary lymphadenopathy. Lungs/Pleura: Areas of mild subsegmental atelectasis or scarring are noted in the lung bases bilaterally, most severe in the right middle lobe. No acute consolidative airspace disease. No pleural effusions. Tiny 3 mm pulmonary nodule in the periphery of the right middle lobe (axial image 85 of series 6). No other larger more suspicious appearing pulmonary nodules or masses are noted. Upper Abdomen: Unremarkable. Musculoskeletal: There are no aggressive appearing lytic or blastic lesions noted in the visualized portions of the skeleton. Review of the MIP images confirms the above findings. IMPRESSION: 1. Severely limited examination secondary to extensive patient respiratory motion. With these  limitations in mind there is no central or lobar sized pulmonary embolism. Segmental and subsegmental sized emboli are not excluded on the basis of today's examination. 2. 3 mm right middle lobe pulmonary nodule. This is nonspecific, but statistically likely benign. No follow-up needed if patient is low-risk.This recommendation follows the consensus statement: Guidelines for Management of Incidental Pulmonary Nodules Detected on CT Images: From the Fleischner Society 2017; Radiology 2017; 284:228-243. Electronically Signed   By: Vinnie Langton M.D.   On: 07/20/2022 05:11   DG Chest Portable 1 View  Result Date: 07/20/2022 CLINICAL DATA:  Increasing shortness of breath over the past 2 days EXAM: PORTABLE CHEST 1 VIEW COMPARISON:  01/31/2007 FINDINGS: The heart size and mediastinal contours are within normal limits. Both lungs are clear. The visualized skeletal structures are unremarkable. IMPRESSION: No active disease. Electronically Signed   By: Inez Catalina M.D.   On: 07/20/2022 02:24     LOS: 0 days   Antonieta Pert, MD [facility]  07/21/2022, 9:32 AM

## 2022-07-21 NOTE — Consult Note (Signed)
NAME:  Michele Collins, MRN:  [id number], DOB:  11-Feb-1986, LOS: 0 ADMISSION DATE:  07/20/2022, CONSULTATION DATE:  07/21/22 REFERRING MD:  Lupita Leash - TR[facility], CHIEF COMPLAINT:  SOB   History of Present Illness:  36 yo F PMH tobacco use disorder, crack cocaine, presumed allergic rhinitis with chronic post-nasal drip and cough presented to ED 10/3 with SOB. Associated wheeze, cough. Sounds like about a month and a half ago pt had laryngitis and about a month ago her cough became more regular and she had some tightness with breathing. This was fairly consistent until about 9/30 when her SOB started to worsen, as did the cough. On 10/3 she felt like she could not breathe and that her lungs were tight, prompted ED presentation.   Admitted to [facility], started on solumedrol, duonebs, transitioned to PO pred 10/4. On and off low rate [place], currently SpO2 100% on [facility]   PCCM consulted 10/4 in this setting  In d/w pt -- smokes a pack a cigarettes every 3 days, since she was about 20. Smokes crack 2-3x/week for about 5 years.  Infrequent EtOH use. No other drug use. No change in environment/exposures. C/o year round allergies, post nasal drip and cough. In the past when her breathing has felt tight, she has used a friend's inhaler which has helped a bit. No recent sick contacts.  Pertinent  Medical History   Tobacco use disorder Crack cocaine use Allergic rhinitis   Significant Hospital Events: Including procedures, antibiotic start and stop dates in addition to other pertinent events   10/3 ED for SOB, admit to [facility] started on solumedrol and duneb 10/4 ccm consult   Interim History / Subjective:  On 2LNC   Hgb this morning down to 7.7 from 9  Objective   Blood pressure (!) 163/98, pulse (!) 115, temperature 97.8 F (36.6 C), temperature source Axillary, resp. rate (!) 28, height 5\' 8"  (1.727 m), weight 72.6 kg, SpO2 100 %.        Intake/Output Summary (Last 24 hours) at 07/21/2022 0955 Last data filed at  07/21/2022 0800 Gross per 24 hour  Intake --  Output 500 ml  Net -500 ml   Filed Weights   07/20/22 0142  Weight: 72.6 kg    Examination: General: WDWN adult F NAD  HENT: NCAT pink mm  Lungs: Diffuse wheeze.  Cardiovascular: tachycardic, regular  Abdomen: soft ndnt  Extremities: no acute joint deformity Neuro: AAOx3 following commands GU: Defer Psych: pleasant, cooperative, anxious psychomotor movement  Resolved Hospital Problem list     Assessment & Plan:   Dyspnea Allergic Rhinitis  Tobacco use disorder Crack-cocaine use  ?Vocal cord dysfunction vs laryngitis -? Underlying asthma  -as below is anemic which could contribute to dyspnea, but think it is more likely that the etiology of the wheeze is culprit  P -send an RVP -changing steroids back to IV solumedrol 40mg  q12  -IS, mobility, wean O2 (right now on [facility] for comfort)  -2g IV mag -add monteleukast  -cont duoneb, add pulmicort and PRN albuterol -consider continuous albuterol neb -if vocal quality doesn't improve with time, consider ENT evaluation -smoking/substance cessation support -nicotine patch  Microcytic Anemia -iron deficiency - receiving Fe, B12  - follow H/H   Best Practice (right click and "Reselect all SmartList Selections" daily)   Diet/type: Regular consistency (see orders) DVT prophylaxis: LMWH GI prophylaxis: N/A Lines: N/A Foley:  N/A Code Status:  DNR Last date of multidisciplinary goals of care discussion [per primary ]  Labs   CBC: Recent Labs  Lab 07/20/22 0135 07/21/22 0255  WBC 7.3 7.5  HGB 9.0* 7.7*  HCT 31.0* 26.8*  MCV 77.1* 76.8*  PLT 277 580    Basic Metabolic Panel: Recent Labs  Lab 07/20/22 0135 07/21/22 0255  NA 137 138  K 3.5 3.9  CL 108 110  CO2 21* 22  GLUCOSE 93 102*  BUN 9 13  CREATININE 0.66 0.61  CALCIUM 8.9 9.0  MG 1.7  --    GFR: Estimated Creatinine Clearance: 99 mL/min (by C-G formula based on SCr of 0.61 mg/dL). Recent Labs   Lab 07/20/22 0135 07/21/22 0255  WBC 7.3 7.5    Liver Function Tests: Recent Labs  Lab 07/21/22 0255  AST 13*  ALT 13  ALKPHOS 36*  BILITOT 0.4  PROT 7.2  ALBUMIN 3.3*   No results for input(s): "LIPASE", "AMYLASE" in the last 168 hours. No results for input(s): "AMMONIA" in the last 168 hours.  ABG No results found for: "PHART", "PCO2ART", "PO2ART", "HCO3", "TCO2", "ACIDBASEDEF", "O2SAT"   Coagulation Profile: No results for input(s): "INR", "PROTIME" in the last 168 hours.  Cardiac Enzymes: No results for input(s): "CKTOTAL", "CKMB", "CKMBINDEX", "TROPONINI" in the last 168 hours.  HbA1C: No results found for: "HGBA1C"  CBG: No results for input(s): "GLUCAP" in the last 168 hours.  Review of Systems:   Review of Systems  Constitutional: Negative.   HENT:  Positive for congestion and sinus pain.        Post nasal drip  Eyes: Negative.   Respiratory:  Positive for cough, sputum production, shortness of breath and wheezing. Negative for hemoptysis.   Cardiovascular:  Positive for chest pain.  Gastrointestinal: Negative.   Genitourinary: Negative.   Musculoskeletal: Negative.   Skin: Negative.   Neurological: Negative.   Endo/Heme/Allergies: Negative.   Psychiatric/Behavioral:  Positive for substance abuse. The patient has insomnia.      Past Medical History:  She,  has no past medical history on file.   Surgical History:  History reviewed. No pertinent surgical history.   Social History:   reports that she has been smoking cigarettes. She has a 2.50 pack-year smoking history. She does not have any smokeless tobacco history on file. She reports that she does not drink alcohol.   Family History:  Her family history is not on file.   Allergies No Known Allergies   Home Medications  Prior to Admission medications   Medication Sig Start Date End Date Taking? Authorizing Provider  fluconazole (DIFLUCAN) 150 MG tablet 1 tab po x1 Patient not taking:  Reported on 07/20/2022 06/12/13   Moreno-Coll, Adlih, MD  metroNIDAZOLE (FLAGYL) 500 MG tablet Take 1 tablet (500 mg total) by mouth 2 (two) times daily. Patient not taking: Reported on 07/20/2022 06/12/13   Randa Spike, MD     Critical care time: n/a     Eliseo Gum MSN, AGACNP-BC [place] for pager 07/21/2022, 10:58 AM

## 2022-07-22 ENCOUNTER — Other Ambulatory Visit: Payer: Self-pay | Admitting: Internal Medicine

## 2022-07-22 MED ORDER — PREDNISONE 10 MG PO TABS: ORAL_TABLET | ORAL | 0 refills | Status: DC

## 2022-07-22 MED ORDER — ALBUTEROL SULFATE HFA 108 (90 BASE) MCG/ACT IN AERS: 2.0000 | INHALATION_SPRAY | Freq: Four times a day (QID) | RESPIRATORY_TRACT | 2 refills | Status: DC | PRN

## 2022-07-22 NOTE — Discharge Summary (Signed)
AMA  Patient at this time expresses desire to leave the Hospital immidiately, patient has been warned that this is not Medically advisable at this time, and can result in Medical complications like Death and Disability, patient understands and accepts the risks involved and assumes full responsibilty of this decision. I made my best effort to convince him to stay.  She was admitted for respiratory distress being managed with steroids oxygen-PCCM has seen the patient-on further work-up RVP panel positive for rhinovirus/enterovirus likely the etiology of her reactive airway disease-in respiratory distress. Nursing informed that patient was able to count oxygen and saturating well on room air. When I visited her she was playing cards with her boyfriend and room mate. After that seemed moist to leave AGAINST MEDICAL ADVICE despite our recommendation not to do so despite explanation that she may have respiratory distress, and the that could lead to significant disability morbidity including death-patient verbalized understanding was alert awake oriented x3 and signed out AGAINST MEDICAL ADVICE. We made OUR best effort to convince her to stay.  I did send out prescription for steroids and inhalers so that it could help her breathe better- and I was able to call her 07/22/22 and speak to her- was feeling ok and would pick up the rx.  Michele Collins M.D on 07/22/2022 at 11:34 AM  [facility]  Time < 30 minutes  Last Note Below   PROGRESS NOTE Michele Collins  KAJ:[id number] DOB: [birth year] DOA: 07/20/2022 PCP: Patient, No Pcp Per   Brief Narrative/Hospital Course: 36 y.o. female with medical history significant of tobacco abuse, cocaine abuse presented with shortness of breath x 3 days, had nonproductive cough no fever nausea vomiting or sick contacts.  In  the ED chest x-ray severely limited due to extensive respiratory motion, 3 mm right middle lobe pulmonary nodule, CTA limited no central or lobar/pulmonary embolism 3 mm right middle lobe pulmonary nodule nonspecific, labs with hemoglobin 9.0 MCV 77 stable renal function magnesium 1.7, COVID-19 flu negative BNP negative troponin negative x2 fecal occult blood negative TSH 0.6 B12 223 folate 12 ferritin 8.  Subjective: Seen this am C/o difficulty taking deep breath Having like this for about a month C/o chest pain midsternal PTA but only with cough that is dry- now c/o tightness  No fever chills, on weight loss  Assessment and Plan: Principal Problem:   Shortness of breath Active Problems:   Iron deficiency anemia   Tobacco use disorder   Cocaine abuse (HCC)   Respiratory failure (HCC)   Non-seasonal allergic rhinitis  Dyspnea/shortness of breath/wheezing for a month Acute hypoxic respiratory failure w/ respiratory distress: Work-up unremarkable so far w/ COVID flu, CT angio.  Troponin negative x2 BNP normal.  Unclear etiology suspect in setting of cocaine abuse tobacco abuse, reactive airway- as she has b/l diminished breath with wheezing complicated by anemia. Cont Solu-Medrol, nebs,will get TTE CheCk RVP panel, consulted PCCM. No known hx of Asthma.  Microcytic anemia with iron deficiency: B12 on lower side.  Folate normal.  Will benefit with iron supplementation B12 supplementation.  Hemoglobin downtrended if further drops will benefit with transfusion especially with shortness of breath. Reports she has  regular mentrual periods- active now. Recent Labs  Lab 07/20/22 0135 07/21/22 0255  HGB 9.0* 7.7*  HCT 31.0* 26.8*    Hypomagnesemia-repleted. Tobacco abuse/Cocaine abuse: cessation advised.  DVT prophylaxis:  Code Status:   Code Status: Prior Family Communication: plan of care discussed with patient at bedside. Patient status is: observation, remains hospitalized due to  hypoxia-continue to monitor in stepdown.  At risk of decompensation.  Level of care: Stepdown   Dispo: The patient is from: home with roommate            Anticipated disposition: TBD Objective: Vitals last 24 hrs: Vitals:   07/21/22 1551 07/21/22 1600 07/21/22 1700 07/21/22 1724  BP:    (!) 147/85  Pulse:  (!) 120 (!) 121 (!) 140  Resp:  20 (!) 27 20  Temp: 98.4 F (36.9 C)     TempSrc: Oral     SpO2:  100% 97% 95%  Weight:      Height:       Weight change:   Physical Examination: General exam: alert awake,older than stated age, weak appearing. HEENT:Oral mucosa moist, Ear/Nose WNL grossly, dentition normal. Respiratory system: bilaterally diminished BS DIFFUSELY, wheezing+ diffusely,no use of accessory muscle Cardiovascular system: S1 & S2 +, No JVD. Gastrointestinal system: Abdomen soft,NT,ND, BS+ Nervous System:Alert, awake, moving extremities and grossly nonfocal Extremities: LE edema NEG,distal peripheral pulses palpable.  Skin: No rashes,no icterus. MSK: Normal muscle bulk,tone, power  Medications reviewed:  Scheduled Meds:   Continuous Infusions:    Diet Order     None      Intake/Output Summary (Last 24 hours) at 07/22/2022 1134 Last data filed at 07/21/2022 1345 Gross per 24 hour  Intake --  Output 500 ml  Net -500 ml   Net IO Since Admission: -1,000 mL [07/22/22 1134]  Wt Readings from Last 3 Encounters:  07/20/22 72.6 kg     Unresulted Labs (From admission, onward)     Start     Ordered   07/21/22 1430  Urine drug profile, 9 drugs (performed at [facility])  Once,   R        07/21/22 1429          Data Reviewed: I have personally reviewed following labs and imaging studies CBC: Recent Labs  Lab 07/20/22 0135 07/21/22 0255  WBC 7.3 7.5  HGB 9.0* 7.7*  HCT 31.0* 26.8*  MCV 77.1* 76.8*  PLT 277 248   Basic Metabolic Panel: Recent Labs  Lab 07/20/22 0135 07/21/22 0255  NA 137 138  K 3.5 3.9  CL 108 110  CO2 21* 22  GLUCOSE 93  102*  BUN 9 13  CREATININE 0.66 0.61  CALCIUM 8.9 9.0  MG 1.7  --    GFR: Estimated Creatinine Clearance: 99 mL/min (by C-G formula based on SCr of 0.61 mg/dL). Liver Function Tests: Recent Labs  Lab 07/21/22 0255  AST 13*  ALT 13  ALKPHOS 36*  BILITOT 0.4  PROT 7.2  ALBUMIN 3.3*   Thyroid Function Tests: Recent Labs    07/20/22 0544  TSH 0.653   Sepsis Labs: No results for input(s): "PROCALCITON", "LATICACIDVEN" in the last 168 hours.  Antimicrobials: Anti-infectives (From admission, onward)    None      Culture/Microbiology No results found for: "SDES", "SPECREQUEST", "CULT", "REPTSTATUS"  Other culture-see note  Radiology Studies: ECHOCARDIOGRAM COMPLETE  Result Date: 07/21/2022    ECHOCARDIOGRAM REPORT   Patient Name:   Michele Collins [facility] Date of Exam: 07/21/2022 Medical Rec #:  [id number]        Height:       68.0 in Accession #:    [id number]       Weight:       160.0 lb Date of Birth:  September 23, 1986        BSA:          1.859 m Patient Age:    36 years         BP:           144/84 mmHg Patient Gender: F                HR:           96 bpm. Exam Location:  Inpatient Procedure: 2D Echo, Cardiac Doppler and Color Doppler Indications:    Dyspnea  History:        Patient has no prior history of Echocardiogram examinations.                 Risk Factors:Current Smoker. ETOH, cocaine abuse.  Sonographer:    [name] Referring Phys: [id number] [place]  Sonographer Comments: Technically difficult study due to poor echo windows. Image acquisition challenging due to respiratory motion and Image acquisition challenging due to patient body habitus. IMPRESSIONS  1. Left ventricular ejection fraction, by estimation, is 55 to 60%. The left ventricle has normal function. The left ventricle has no regional wall motion abnormalities. Left ventricular diastolic parameters are consistent with Grade I diastolic dysfunction (impaired relaxation).  2. Right ventricular systolic function is  normal. The right ventricular size is normal. Tricuspid regurgitation signal is inadequate for assessing PA pressure.  3. The mitral valve is normal in structure. No evidence of mitral valve regurgitation. No evidence of mitral stenosis.  4. The aortic valve is tricuspid. Aortic valve regurgitation is not visualized. No aortic stenosis is present.  5. The inferior vena cava is normal in size with greater than 50% respiratory variability, suggesting right atrial pressure of 3 mmHg. FINDINGS  Left Ventricle: Left ventricular ejection fraction, by estimation, is 55 to 60%. The left ventricle has normal function. The left ventricle has no regional wall motion abnormalities. The left ventricular internal cavity size was normal in size. There is  no left ventricular hypertrophy. Left ventricular diastolic parameters are consistent with Grade I diastolic dysfunction (impaired relaxation). Right Ventricle: The right ventricular size is normal. No increase in right ventricular wall thickness. Right ventricular systolic function is normal. Tricuspid regurgitation signal is inadequate for assessing PA pressure. Left Atrium: Left atrial size was normal in size. Right Atrium: Right atrial size was normal in size. Pericardium: There is no evidence of pericardial effusion. Mitral Valve: The mitral valve is normal in structure. No evidence of mitral valve regurgitation. No evidence of mitral valve stenosis. Tricuspid Valve: The tricuspid valve is normal in structure. Tricuspid valve regurgitation is trivial. Aortic Valve: The aortic valve is tricuspid. Aortic valve regurgitation is not visualized. No aortic stenosis is present. Pulmonic Valve: The pulmonic valve was normal in structure. Pulmonic valve regurgitation is trivial. Aorta: The aortic root is normal in size and structure. Venous: The inferior vena cava is normal in size with greater than 50% respiratory variability, suggesting right atrial pressure of 3 mmHg. IAS/Shunts:  No atrial level shunt detected by color flow Doppler.  LEFT VENTRICLE PLAX 2D LVIDd:         5.00 cm   Diastology LVIDs:         3.50 cm   LV  e' medial:    11.30 cm/s LV PW:         0.90 cm   LV E/e' medial:  6.7 LV IVS:        0.60 cm   LV e' lateral:   11.10 cm/s LVOT diam:     2.10 cm   LV E/e' lateral: 6.8 LV SV:         74 LV SV Index:   40 LVOT Area:     3.46 cm  RIGHT VENTRICLE             IVC RV S prime:     13.90 cm/s  IVC diam: 1.10 cm TAPSE (M-mode): 1.8 cm LEFT ATRIUM             Index        RIGHT ATRIUM          Index LA diam:        3.20 cm 1.72 cm/m   RA Area:     7.38 cm LA Vol (A2C):   45.0 ml 24.21 ml/m  RA Volume:   12.40 ml 6.67 ml/m LA Vol (A4C):   25.3 ml 13.61 ml/m LA Biplane Vol: 35.0 ml 18.83 ml/m  AORTIC VALVE LVOT Vmax:   127.00 cm/s LVOT Vmean:  90.800 cm/s LVOT VTI:    0.213 m  AORTA Ao Root diam: 3.00 cm MITRAL VALVE MV Area (PHT): 3.34 cm    SHUNTS MV Decel Time: 227 msec    Systemic VTI:  0.21 m MV E velocity: 75.80 cm/s  Systemic Diam: 2.10 cm MV A velocity: 82.30 cm/s MV E/A ratio:  0.92 Dalton McleanMD Electronically signed by Wilfred Lacy Signature Date/Time: 07/21/2022/4:11:32 PM    Final      LOS: 1 day   Michele Boast, MD [facility]  07/22/2022, 11:34 AM

## 2022-07-29 LAB — DRUG PROFILE, UR, 9 DRUGS (LABCORP)
Amphetamines, Urine: NEGATIVE ng/mL
Barbiturate, Ur: NEGATIVE ng/mL
Benzodiazepine Quant, Ur: NEGATIVE ng/mL
Cannabinoid Quant, Ur: NEGATIVE ng/mL
Cocaine (Metab.): POSITIVE ng/mL — AB
Methadone Screen, Urine: NEGATIVE ng/mL
Opiate Quant, Ur: NEGATIVE ng/mL
Phencyclidine, Ur: NEGATIVE ng/mL
Propoxyphene, Urine: NEGATIVE ng/mL

## 2022-09-29 ENCOUNTER — Encounter (HOSPITAL_COMMUNITY): Payer: Self-pay

## 2022-09-29 ENCOUNTER — Emergency Department (HOSPITAL_COMMUNITY): Payer: Self-pay

## 2022-09-29 ENCOUNTER — Inpatient Hospital Stay (HOSPITAL_COMMUNITY)
Admission: EM | Admit: 2022-09-29 | Discharge: 2022-10-03 | DRG: 193 | Disposition: A | Discharge status: Home / Self Care | Payer: Self-pay | Attending: Internal Medicine | Admitting: Internal Medicine

## 2022-09-29 ENCOUNTER — Other Ambulatory Visit: Payer: Self-pay

## 2022-09-29 DIAGNOSIS — J101 Influenza due to other identified influenza virus with other respiratory manifestations: Secondary | ICD-10-CM

## 2022-09-29 DIAGNOSIS — J159 Unspecified bacterial pneumonia: Secondary | ICD-10-CM | POA: Diagnosis present

## 2022-09-29 DIAGNOSIS — J189 Pneumonia, unspecified organism: Secondary | ICD-10-CM | POA: Diagnosis present

## 2022-09-29 DIAGNOSIS — J4551 Severe persistent asthma with (acute) exacerbation: Secondary | ICD-10-CM

## 2022-09-29 DIAGNOSIS — E869 Volume depletion, unspecified: Secondary | ICD-10-CM | POA: Diagnosis not present

## 2022-09-29 DIAGNOSIS — F1410 Cocaine abuse, uncomplicated: Secondary | ICD-10-CM | POA: Diagnosis present

## 2022-09-29 DIAGNOSIS — Z599 Problem related to housing and economic circumstances, unspecified: Secondary | ICD-10-CM

## 2022-09-29 DIAGNOSIS — F419 Anxiety disorder, unspecified: Secondary | ICD-10-CM | POA: Diagnosis present

## 2022-09-29 DIAGNOSIS — F17210 Nicotine dependence, cigarettes, uncomplicated: Secondary | ICD-10-CM | POA: Diagnosis present

## 2022-09-29 DIAGNOSIS — J45901 Unspecified asthma with (acute) exacerbation: Secondary | ICD-10-CM | POA: Diagnosis present

## 2022-09-29 DIAGNOSIS — J9601 Acute respiratory failure with hypoxia: Secondary | ICD-10-CM | POA: Diagnosis present

## 2022-09-29 DIAGNOSIS — Z79899 Other long term (current) drug therapy: Secondary | ICD-10-CM

## 2022-09-29 DIAGNOSIS — F4310 Post-traumatic stress disorder, unspecified: Secondary | ICD-10-CM | POA: Diagnosis not present

## 2022-09-29 DIAGNOSIS — D638 Anemia in other chronic diseases classified elsewhere: Secondary | ICD-10-CM | POA: Diagnosis present

## 2022-09-29 DIAGNOSIS — J1008 Influenza due to other identified influenza virus with other specified pneumonia: Principal | ICD-10-CM | POA: Diagnosis present

## 2022-09-29 DIAGNOSIS — Z7952 Long term (current) use of systemic steroids: Secondary | ICD-10-CM

## 2022-09-29 DIAGNOSIS — E872 Acidosis, unspecified: Secondary | ICD-10-CM | POA: Diagnosis not present

## 2022-09-29 DIAGNOSIS — D509 Iron deficiency anemia, unspecified: Secondary | ICD-10-CM | POA: Diagnosis present

## 2022-09-29 DIAGNOSIS — F32A Depression, unspecified: Secondary | ICD-10-CM | POA: Diagnosis present

## 2022-09-29 DIAGNOSIS — Z1152 Encounter for screening for COVID-19: Secondary | ICD-10-CM

## 2022-09-29 DIAGNOSIS — F1290 Cannabis use, unspecified, uncomplicated: Secondary | ICD-10-CM | POA: Diagnosis present

## 2022-09-29 DIAGNOSIS — R251 Tremor, unspecified: Secondary | ICD-10-CM | POA: Diagnosis present

## 2022-09-29 DIAGNOSIS — N939 Abnormal uterine and vaginal bleeding, unspecified: Secondary | ICD-10-CM | POA: Diagnosis present

## 2022-09-29 DIAGNOSIS — F411 Generalized anxiety disorder: Secondary | ICD-10-CM | POA: Diagnosis not present

## 2022-09-29 DIAGNOSIS — R451 Restlessness and agitation: Secondary | ICD-10-CM | POA: Diagnosis present

## 2022-09-29 HISTORY — DX: Influenza due to other identified influenza virus with other respiratory manifestations: J10.1

## 2022-09-29 LAB — RESP PANEL BY RT-PCR (RSV, FLU A&B, COVID)  RVPGX2
Influenza A by PCR: POSITIVE — AB
Influenza B by PCR: NEGATIVE
Resp Syncytial Virus by PCR: NEGATIVE
SARS Coronavirus 2 by RT PCR: NEGATIVE

## 2022-09-29 LAB — TROPONIN I (HIGH SENSITIVITY)
Troponin I (High Sensitivity): 2 ng/L (ref <18)
Troponin I (High Sensitivity): 3 ng/L (ref <18)

## 2022-09-29 LAB — BASIC METABOLIC PANEL
Anion gap: 8 (ref 5–15)
BUN: 9 mg/dL (ref 6–20)
CO2: 21 mmol/L — ABNORMAL LOW (ref 22–32)
Calcium: 8.7 mg/dL — ABNORMAL LOW (ref 8.9–10.3)
Chloride: 106 mmol/L (ref 98–111)
Creatinine, Ser: 0.83 mg/dL (ref 0.44–1.00)
GFR, Estimated: >60 mL/min (ref >60)
Glucose, Bld: 127 mg/dL — ABNORMAL HIGH (ref 70–99)
Potassium: 4 mmol/L (ref 3.5–5.1)
Sodium: 135 mmol/L (ref 135–145)

## 2022-09-29 LAB — CBC
HCT: 31.5 % — ABNORMAL LOW (ref 36.0–46.0)
HCT: 28.3 % — ABNORMAL LOW (ref 36.0–46.0)
Hemoglobin: 9.1 g/dL — ABNORMAL LOW (ref 12.0–15.0)
Hemoglobin: 8.1 g/dL — ABNORMAL LOW (ref 12.0–15.0)
MCH: 22.4 pg — ABNORMAL LOW (ref 26.0–34.0)
MCH: 22.4 pg — ABNORMAL LOW (ref 26.0–34.0)
MCHC: 28.9 g/dL — ABNORMAL LOW (ref 30.0–36.0)
MCHC: 28.6 g/dL — ABNORMAL LOW (ref 30.0–36.0)
MCV: 77.6 fL — ABNORMAL LOW (ref 80.0–100.0)
MCV: 78.2 fL — ABNORMAL LOW (ref 80.0–100.0)
Platelets: 195 10*3/uL (ref 150–400)
Platelets: 186 10*3/uL (ref 150–400)
RBC: 4.06 MIL/uL (ref 3.87–5.11)
RBC: 3.62 MIL/uL — ABNORMAL LOW (ref 3.87–5.11)
RDW: 16.8 % — ABNORMAL HIGH (ref 11.5–15.5)
RDW: 16.7 % — ABNORMAL HIGH (ref 11.5–15.5)
WBC: 7.4 10*3/uL (ref 4.0–10.5)
WBC: 7.4 10*3/uL (ref 4.0–10.5)
nRBC: 0 % (ref 0.0–0.2)
nRBC: 0 % (ref 0.0–0.2)

## 2022-09-29 LAB — BLOOD GAS, VENOUS
Acid-base deficit: 3.9 mmol/L — ABNORMAL HIGH (ref 0.0–2.0)
Acid-base deficit: 10.6 mmol/L — ABNORMAL HIGH (ref 0.0–2.0)
Bicarbonate: 21.6 mmol/L (ref 20.0–28.0)
Bicarbonate: 14.1 mmol/L — ABNORMAL LOW (ref 20.0–28.0)
O2 Saturation: 78.3 %
O2 Saturation: 99.2 %
Patient temperature: 37
Patient temperature: 37
pCO2, Ven: 40 mmHg — ABNORMAL LOW (ref 44–60)
pCO2, Ven: 28 mmHg — ABNORMAL LOW (ref 44–60)
pH, Ven: 7.34 (ref 7.25–7.43)
pH, Ven: 7.31 (ref 7.25–7.43)
pO2, Ven: 48 mmHg — ABNORMAL HIGH (ref 32–45)
pO2, Ven: 125 mmHg — ABNORMAL HIGH (ref 32–45)

## 2022-09-29 LAB — I-STAT BETA HCG BLOOD, ED (MC, WL, AP ONLY): I-stat hCG, quantitative: <5 m[IU]/mL (ref <5)

## 2022-09-29 LAB — CREATININE, SERUM
Creatinine, Ser: 0.78 mg/dL (ref 0.44–1.00)
GFR, Estimated: >60 mL/min (ref >60)

## 2022-09-29 LAB — LACTIC ACID, PLASMA: Lactic Acid, Venous: 1.4 mmol/L (ref 0.5–1.9)

## 2022-09-29 LAB — MRSA NEXT GEN BY PCR, NASAL: MRSA by PCR Next Gen: NOT DETECTED

## 2022-09-29 MED ORDER — SODIUM CHLORIDE 0.9 % IV SOLN
100.0000 mg | Freq: Once | INTRAVENOUS | Status: AC
  Administered 2022-09-29: 100 mg via INTRAVENOUS
  Filled 2022-09-29: qty 100

## 2022-09-29 MED ORDER — IPRATROPIUM BROMIDE 0.02 % IN SOLN
0.5000 mg | Freq: Once | RESPIRATORY_TRACT | Status: AC
  Administered 2022-09-29: 0.5 mg via RESPIRATORY_TRACT
  Filled 2022-09-29: qty 2.5

## 2022-09-29 MED ORDER — CHLORHEXIDINE GLUCONATE CLOTH 2 % EX PADS
6.0000 | MEDICATED_PAD | Freq: Every day | CUTANEOUS | Status: DC
  Administered 2022-09-29 – 2022-10-01 (×2): 6 via TOPICAL

## 2022-09-29 MED ORDER — DOCUSATE SODIUM 100 MG PO CAPS: 100.0000 mg | ORAL_CAPSULE | Freq: Two times a day (BID) | ORAL | Status: DC | PRN

## 2022-09-29 MED ORDER — OSELTAMIVIR PHOSPHATE 75 MG PO CAPS
75.0000 mg | ORAL_CAPSULE | Freq: Two times a day (BID) | ORAL | Status: DC
  Administered 2022-09-29 – 2022-10-03 (×9): 75 mg via ORAL
  Filled 2022-09-29 (×9): qty 1

## 2022-09-29 MED ORDER — DOXYCYCLINE HYCLATE 100 MG PO TABS
100.0000 mg | ORAL_TABLET | Freq: Two times a day (BID) | ORAL | Status: DC
  Administered 2022-09-29 – 2022-10-03 (×9): 100 mg via ORAL
  Filled 2022-09-29 (×9): qty 1

## 2022-09-29 MED ORDER — SODIUM CHLORIDE 0.9 % IV BOLUS
1000.0000 mL | Freq: Once | INTRAVENOUS | Status: AC
  Administered 2022-09-29: 1000 mL via INTRAVENOUS

## 2022-09-29 MED ORDER — DOXYCYCLINE HYCLATE 100 MG PO TABS: 100.0000 mg | ORAL_TABLET | Freq: Once | ORAL | Status: DC

## 2022-09-29 MED ORDER — IPRATROPIUM-ALBUTEROL 0.5-2.5 (3) MG/3ML IN SOLN
3.0000 mL | Freq: Once | RESPIRATORY_TRACT | Status: AC
  Administered 2022-09-29: 3 mL via RESPIRATORY_TRACT
  Filled 2022-09-29: qty 3

## 2022-09-29 MED ORDER — EPINEPHRINE 0.3 MG/0.3ML IJ SOAJ
0.3000 mg | Freq: Once | INTRAMUSCULAR | Status: AC
  Administered 2022-09-29: 0.3 mg via INTRAMUSCULAR
  Filled 2022-09-29: qty 0.3

## 2022-09-29 MED ORDER — ACETAMINOPHEN 650 MG RE SUPP
650.0000 mg | Freq: Once | RECTAL | Status: AC
  Administered 2022-09-29: 650 mg via RECTAL
  Filled 2022-09-29: qty 1

## 2022-09-29 MED ORDER — ENOXAPARIN SODIUM 40 MG/0.4ML IJ SOSY
40.0000 mg | PREFILLED_SYRINGE | INTRAMUSCULAR | Status: DC
  Administered 2022-09-29 – 2022-10-01 (×3): 40 mg via SUBCUTANEOUS
  Filled 2022-09-29 (×3): qty 0.4

## 2022-09-29 MED ORDER — SODIUM CHLORIDE 0.9 % IV SOLN
1.0000 g | Freq: Once | INTRAVENOUS | Status: AC
  Administered 2022-09-29: 1 g via INTRAVENOUS
  Filled 2022-09-29: qty 10

## 2022-09-29 MED ORDER — METHYLPREDNISOLONE SODIUM SUCC 125 MG IJ SOLR
125.0000 mg | Freq: Once | INTRAMUSCULAR | Status: AC
  Administered 2022-09-29: 125 mg via INTRAVENOUS
  Filled 2022-09-29: qty 2

## 2022-09-29 MED ORDER — LIP MEDEX EX OINT
TOPICAL_OINTMENT | CUTANEOUS | Status: DC | PRN
  Administered 2022-09-29: 1 via TOPICAL
  Filled 2022-09-29: qty 7

## 2022-09-29 MED ORDER — LORAZEPAM 2 MG/ML IJ SOLN
0.5000 mg | INTRAMUSCULAR | Status: DC | PRN
  Administered 2022-09-29 – 2022-09-30 (×3): 0.5 mg via INTRAVENOUS
  Filled 2022-09-29 (×3): qty 1

## 2022-09-29 MED ORDER — MAGNESIUM SULFATE 2 GM/50ML IV SOLN
2.0000 g | Freq: Once | INTRAVENOUS | Status: AC
  Administered 2022-09-29: 2 g via INTRAVENOUS
  Filled 2022-09-29: qty 50

## 2022-09-29 MED ORDER — ALBUTEROL SULFATE (2.5 MG/3ML) 0.083% IN NEBU
10.0000 mg/h | INHALATION_SOLUTION | RESPIRATORY_TRACT | Status: DC
  Administered 2022-09-29 (×2): 10 mg/h via RESPIRATORY_TRACT
  Filled 2022-09-29: qty 12
  Filled 2022-09-29 (×2): qty 3

## 2022-09-29 MED ORDER — ORAL CARE MOUTH RINSE: 15.0000 mL | OROMUCOSAL | Status: DC | PRN

## 2022-09-29 MED ORDER — METHYLPREDNISOLONE SODIUM SUCC 40 MG IJ SOLR
40.0000 mg | Freq: Three times a day (TID) | INTRAMUSCULAR | Status: DC
  Administered 2022-09-29 – 2022-09-30 (×4): 40 mg via INTRAVENOUS
  Filled 2022-09-29 (×4): qty 1

## 2022-09-29 MED ORDER — POLYETHYLENE GLYCOL 3350 17 G PO PACK: 17.0000 g | PACK | Freq: Every day | ORAL | Status: DC | PRN

## 2022-09-29 MED ORDER — IPRATROPIUM-ALBUTEROL 0.5-2.5 (3) MG/3ML IN SOLN
3.0000 mL | Freq: Four times a day (QID) | RESPIRATORY_TRACT | Status: DC
  Administered 2022-09-29 – 2022-10-03 (×18): 3 mL via RESPIRATORY_TRACT
  Filled 2022-09-29 (×17): qty 3

## 2022-09-29 MED ORDER — GUAIFENESIN 100 MG/5ML PO LIQD
10.0000 mL | ORAL | Status: DC | PRN
  Administered 2022-09-29 – 2022-10-03 (×10): 10 mL via ORAL
  Filled 2022-09-29 (×10): qty 10

## 2022-09-29 MED ORDER — LACTATED RINGERS IV SOLN: INTRAVENOUS | Status: DC

## 2022-09-29 NOTE — ED Notes (Signed)
RRT notified of breathing treatments

## 2022-09-29 NOTE — ED Triage Notes (Signed)
Arrives EMS from home. Suddenly woke up ~2am with  severe shob and labored respirations. Found at 82% room air. Began in NRB with o2 increased to upper 90's. Attempted to use CPAP but unable to tolerate.   Recently admitted for breathing issues but unknown cause. Was given albuterol inhaler but has not had any improvement.   Significant other recovering from suspected URI.   Request to be switched from DNR to FULL code,

## 2022-09-29 NOTE — Progress Notes (Addendum)
This writer attempted to place pt on bipap twice, but patient unable to tolerate it.  Pt stated the pressure was too much and that it made her cough.  Pt also stated the bipap made it harder for her to breathe.  Pt still unable to tolerate it after bipap settings turned down.  RN aware.  Pt placed back on 3l nasal cannula, HR120, RR26, spo2 95%.  Bipap remains in room on standby as needed.

## 2022-09-29 NOTE — ED Provider Notes (Addendum)
[facility] DEPT Provider Note   CSN: [id number] Arrival date & time: 09/29/22  0334     History  Chief Complaint  Patient presents with   Shortness of Breath    Michele Collins is a 36 y.o. female with history of iron deficiency anemia, tobacco use, cocaine abuse, asthma who presents the emergency department complaining of shortness of breath.  Patient states that she woke up around 2 AM with severe shortness of breath.  Complaining of productive cough.  Says her boyfriend has been sick recently.  EMS found her to be at 82% on room air, this improved with a nonrebreather to the upper 90s.  They attempted to use CPAP, but patient was unable to tolerate this during transport.  Patient herself had used her albuterol inhaler without improvement.  Patient states that she was recently admitted for "breathing issues", and went to the ICU.  She left against medical advice at that time.    Shortness of Breath Associated symptoms: cough        Home Medications Prior to Admission medications   Medication Sig Start Date End Date Taking? Authorizing Provider  albuterol (VENTOLIN HFA) 108 (90 Base) MCG/ACT inhaler Inhale 2 puffs into the lungs every 6 (six) hours as needed for wheezing or shortness of breath. 07/22/22   Antonieta Pert, MD  fluconazole (DIFLUCAN) 150 MG tablet 1 tab po x1 Patient not taking: Reported on 07/20/2022 06/12/13   Moreno-Coll, Adlih, MD  metroNIDAZOLE (FLAGYL) 500 MG tablet Take 1 tablet (500 mg total) by mouth 2 (two) times daily. Patient not taking: Reported on 07/20/2022 06/12/13   Moreno-Coll, Adlih, MD  predniSONE (DELTASONE) 10 MG tablet Take PO 4 tabs daily x 2 days,3 tabs daily x 2 days,2 tabs daily x 2 days,1 tab daily x 2 days then stop. 07/22/22   Antonieta Pert, MD      Allergies    Patient has no known allergies.    Review of Systems   Review of Systems  Respiratory:  Positive for cough, chest tightness and shortness of breath.    All other systems reviewed and are negative.   Physical Exam Updated Vital Signs BP (!) 155/95   Pulse (!) 112   Temp (!) 101.7 F (38.7 C) (Oral)   Resp (!) 32   Ht 5\' 8"  (1.727 m)   Wt 72.6 kg   LMP  (LMP Unknown)   SpO2 99%   BMI 24.33 kg/m  Physical Exam Vitals and nursing note reviewed.  Constitutional:      Appearance: Normal appearance.  HENT:     Head: Normocephalic and atraumatic.  Eyes:     Conjunctiva/sclera: Conjunctivae normal.  Cardiovascular:     Rate and Rhythm: Normal rate and regular rhythm.  Pulmonary:     Effort: Tachypnea, accessory muscle usage and respiratory distress present.     Breath sounds: Decreased air movement present. Wheezing present.  Abdominal:     General: There is no distension.     Palpations: Abdomen is soft.     Tenderness: There is no abdominal tenderness.  Skin:    General: Skin is warm and dry.  Neurological:     General: No focal deficit present.     Mental Status: She is alert.     ED Results / Procedures / Treatments   Labs (all labs ordered are listed, but only abnormal results are displayed) Labs Reviewed  RESP PANEL BY RT-PCR (RSV, FLU A&B, COVID)  RVPGX2 - Abnormal;  Notable for the following components:      Result Value   Influenza A by PCR POSITIVE (*)    All other components within normal limits  BASIC METABOLIC PANEL - Abnormal; Notable for the following components:   CO2 21 (*)    Glucose, Bld 127 (*)    Calcium 8.7 (*)    All other components within normal limits  CBC - Abnormal; Notable for the following components:   Hemoglobin 9.1 (*)    HCT 31.5 (*)    MCV 77.6 (*)    MCH 22.4 (*)    MCHC 28.9 (*)    RDW 16.8 (*)    All other components within normal limits  BLOOD GAS, VENOUS - Abnormal; Notable for the following components:   pCO2, Ven 40 (*)    pO2, Ven 48 (*)    Acid-base deficit 3.9 (*)    All other components within normal limits  CULTURE, BLOOD (ROUTINE X 2)  CULTURE, BLOOD (ROUTINE  X 2)  LACTIC ACID, PLASMA  I-STAT BETA HCG BLOOD, ED ([facility], [facility], [facility] ONLY)  TROPONIN I (HIGH SENSITIVITY)  TROPONIN I (HIGH SENSITIVITY)    EKG None  Radiology DG Chest Port 1 View  Result Date: 09/29/2022 CLINICAL DATA:  Dyspnea EXAM: PORTABLE CHEST 1 VIEW COMPARISON:  07/20/2022 FINDINGS: Right basilar focal pulmonary infiltrate is developed, likely infectious in the appropriate clinical setting. No pneumothorax or pleural effusion. Cardiac size within normal limits. Pulmonary vascularity is normal. No acute bone abnormality. IMPRESSION: 1. Right basilar pneumonic infiltrate. Electronically Signed   By: Fidela Salisbury M.D.   On: 09/29/2022 04:17    Procedures .Critical Care  Performed by: Kateri Plummer, PA-C Authorized by: Kateri Plummer, PA-C   Critical care provider statement:    Critical care time (minutes):  30   Critical care was necessary to treat or prevent imminent or life-threatening deterioration of the following conditions:  Respiratory failure and sepsis   Critical care was time spent personally by me on the following activities:  Development of treatment plan with patient or surrogate, discussions with consultants, evaluation of patient's response to treatment, examination of patient, ordering and review of laboratory studies, ordering and review of radiographic studies, ordering and performing treatments and interventions, pulse oximetry, re-evaluation of patient's condition and review of old charts   Care discussed with: admitting provider   Comments:     BIPAP for respiratory distress, antibiotics, continuous nebulizers     Medications Ordered in ED Medications  doxycycline (VIBRAMYCIN) 100 mg in sodium chloride 0.9 % 250 mL IVPB (100 mg Intravenous New Bag/Given 09/29/22 0500)  albuterol (PROVENTIL) (2.5 MG/3ML) 0.083% nebulizer solution (10 mg/hr Nebulization New Bag/Given 09/29/22 0620)  ipratropium-albuterol (DUONEB) 0.5-2.5 (3) MG/3ML nebulizer solution  3 mL (3 mLs Nebulization Given 09/29/22 0353)  methylPREDNISolone sodium succinate (SOLU-MEDROL) 125 mg/2 mL injection 125 mg (125 mg Intravenous Given 09/29/22 0354)  magnesium sulfate IVPB 2 g 50 mL (0 g Intravenous Stopped 09/29/22 0457)  acetaminophen (TYLENOL) suppository 650 mg (650 mg Rectal Given 09/29/22 0502)  cefTRIAXone (ROCEPHIN) 1 g in sodium chloride 0.9 % 100 mL IVPB (0 g Intravenous Stopped 09/29/22 0528)  EPINEPHrine (EPI-PEN) injection 0.3 mg (0.3 mg Intramuscular Given 09/29/22 0500)  sodium chloride 0.9 % bolus 1,000 mL (0 mLs Intravenous Stopped 09/29/22 0600)  ipratropium (ATROVENT) nebulizer solution 0.5 mg (0.5 mg Nebulization Given 09/29/22 0620)    ED Course/ Medical Decision Making/ A&P  Medical Decision Making Amount and/or Complexity of Data Reviewed Labs: ordered. Radiology: ordered.  Risk OTC drugs. Prescription drug management. Decision regarding hospitalization.  This patient is a 36 y.o. female who presents to the ED for concern of shortness of breath, this involves an extensive number of treatment options, and is a complaint that carries with it a high risk of complications and morbidity. The emergent differential diagnosis prior to evaluation includes, but is not limited to,  CHF, pericardial effusion/tamponade, arrhythmias, ACS, COPD, asthma, bronchitis, pneumonia, pneumothorax, PE, anemia. This is not an exhaustive differential.   Past Medical History / Co-morbidities / Social History: iron deficiency anemia, tobacco use, cocaine abuse, asthma  Additional history: Chart reviewed. Pertinent results include: Patient was admitted to the hospital on 10/3 and was found to be in acute hypoxemic respiratory failure due to probable asthma exacerbation versus reactive airway disease.  She left against medical advice on 10/4.  She states that she was DNR at that time, but would like to rescind this and be full code.  Physical  Exam: Physical exam performed. The pertinent findings include: Patient in respiratory distress, tripoding with accessory muscle usage.  Tachycardic and tachypneic.  Decreased air movement with inspiratory and expiratory wheezing.  Febrile to 100.9 F.  Lab Tests: I ordered, and personally interpreted labs.  The pertinent results include: Hemoglobin improved compared to prior, no leukocytosis.  BMP unremarkable.  Troponin 2.  Negative pregnancy.  Lactic acid 1.4.  Respiratory panel positive for influenza A.   Imaging Studies: I ordered imaging studies including CXR. I independently visualized and interpreted imaging which showed right basilar infiltrate consistent with pneumonia. I agree with the radiologist interpretation.   Cardiac Monitoring:  The patient was maintained on a cardiac monitor.  My attending physician Dr. Madilyn Hook viewed and interpreted the cardiac monitored which showed an underlying rhythm of: sinus tachycardia with rate of 122 bpm. I agree with this interpretation.   Medications: I ordered medication including Solu-Medrol, magnesium, antibiotics, epinephrine, Tylenol, continuous nebulizer for respiratory distress with concern for pneumonia. Reevaluation of the patient after these medicines showed that the patient stayed the same. I have reviewed the patients home medicines and have made adjustments as needed.  Patient did not tolerate BIPAP due to agitation.  Consultations Obtained: I requested consultation with the on call intensivist Dr Jayme Cloud,  and discussed lab and imaging findings as well as pertinent plan - they recommend: admission to ICU.   Disposition: After consideration of the diagnostic results and the patients response to treatment, I feel that patient is requiring critical care and admission to ICU for acute respiratory failure with hypoxia likely due to asthma exacerbation in setting of influenza A infection.  I discussed this case with my attending physician Dr.  Madilyn Hook who cosigned this note including patient's presenting symptoms, physical exam, and planned diagnostics and interventions. Attending physician stated agreement with plan or made changes to plan which were implemented.    Final Clinical Impression(s) / ED Diagnoses Final diagnoses:  Severe asthma with acute exacerbation, unspecified whether persistent  Acute respiratory failure with hypoxia (HCC)  Influenza A  Pneumonia of right lower lobe due to infectious organism    Rx / DC Orders ED Discharge Orders     None      Portions of this report may have been transcribed using voice recognition software. Every effort was made to ensure accuracy; however, inadvertent computerized transcription errors may be present.   Michele Collins, [place] 09/29/22 [phone]  Quintella Reichert, MD 10/04/22 1752

## 2022-09-29 NOTE — Plan of Care (Signed)
  Problem: Education: Goal: Knowledge of General Education information will improve Description: Including pain rating scale, medication(s)/side effects and non-pharmacologic comfort measures Outcome: Progressing   Problem: Clinical Measurements: Goal: Diagnostic test results will improve Outcome: Progressing Goal: Respiratory complications will improve Outcome: Progressing   Problem: Coping: Goal: Level of anxiety will decrease Outcome: Progressing   Problem: Safety: Goal: Ability to remain free from injury will improve Outcome: Progressing   Problem: Skin Integrity: Goal: Risk for impaired skin integrity will decrease Outcome: Progressing   Problem: Respiratory: Goal: Ability to maintain adequate ventilation will improve Outcome: Progressing Goal: Ability to maintain a clear airway will improve Outcome: Progressing

## 2022-09-29 NOTE — Consult Note (Signed)
NAME:  Michele Collins, MRN:  [id number], DOB:  [birth year], LOS: 0 ADMISSION DATE:  09/29/2022, CONSULTATION DATE:  09/29/22 REFERRING MD:  Lockie Mola - EM, CHIEF COMPLAINT:  SOB   History of Present Illness:  36 yo hx asthma polysub abuse Patient came in with about 4 to 5-day history of feeling poorly, fever, cough, shortness of breath Recently in ED in October for shortness of breath  History of polysubstance abuse, tobacco abuse, cocaine abuse, history of asthma  Recent drug use  Pertinent  Medical History  History reviewed. No pertinent past medical history.   Significant Hospital Events: Including procedures, antibiotic start and stop dates in addition to other pertinent events   Chest x-ray 09/29/2022 with right lower lobe infiltrate Influenza A+  Interim History / Subjective:  Shortness of breath, cough, difficulty breathing No chest pain  Objective   Blood pressure (!) 155/86, pulse (!) 114, temperature (!) 101.7 F (38.7 C), temperature source Oral, resp. rate (!) 34, height 5\' 8"  (1.727 m), weight 72.6 kg, SpO2 100 %.    FiO2 (%):  [40 %] 40 %  No intake or output data in the 24 hours ending 09/29/22 0724 Filed Weights   09/29/22 0346  Weight: 72.6 kg    Examination: General: Middle-age, does not appear to be an extremis, does appear with increased work of breathing HENT: Oral mucosa Lungs: Decreased air movement bilaterally some rhonchi at the bases Cardiovascular: S1-S2 appreciated Abdomen: Bowel sounds appreciated Extremities: Clubbing, no edema Neuro: Alert and oriented x 3 GU:   Labs reviewed  Resolved Hospital Problem list     Assessment & Plan:  Influenza A infection -Start oseltamivir  Right lower lobe pneumonia -Continue doxycycline  Asthma exacerbation Acute hypoxemic respiratory failure  Increased work of breathing Unable to tolerate BiPAP -Bronchodilators -Steroids -Continue antibiotic therapy  Chronic anemia  Will admit to  ICU for close monitoring as risk of decompensation is high -Not feel she will be able to tolerate BiPAP -I did discuss with her that the only option we will have will be to have her on a ventilator if she continues to struggle with her breathing  DVT prophylaxis  Best Practice (right click and "Reselect all SmartList Selections" daily)   Diet/type: Regular consistency (see orders) DVT prophylaxis: LMWH GI prophylaxis: N/A Lines: N/A Foley:  N/A Code Status:  full code Last date of multidisciplinary goals of care discussion [pending]  Labs   CBC: Recent Labs  Lab 09/29/22 0345  WBC 7.4  HGB 9.1*  HCT 31.5*  MCV 77.6*  PLT 195    Basic Metabolic Panel: Recent Labs  Lab 09/29/22 0345  NA 135  K 4.0  CL 106  CO2 21*  GLUCOSE 127*  BUN 9  CREATININE 0.83  CALCIUM 8.7*   GFR: Estimated Creatinine Clearance: 95.4 mL/min (by C-G formula based on SCr of 0.83 mg/dL). Recent Labs  Lab 09/29/22 0345 09/29/22 0351  WBC 7.4  --   LATICACIDVEN  --  1.4    Liver Function Tests: No results for input(s): "AST", "ALT", "ALKPHOS", "BILITOT", "PROT", "ALBUMIN" in the last 168 hours. No results for input(s): "LIPASE", "AMYLASE" in the last 168 hours. No results for input(s): "AMMONIA" in the last 168 hours.  ABG    Component Value Date/Time   HCO3 14.1 (L) 09/29/2022 0653   ACIDBASEDEF 10.6 (H) 09/29/2022 0653   O2SAT 99.2 09/29/2022 0653     Coagulation Profile: No results for input(s): "INR", "PROTIME" in the last 168  hours.  Cardiac Enzymes: No results for input(s): "CKTOTAL", "CKMB", "CKMBINDEX", "TROPONINI" in the last 168 hours.  HbA1C: No results found for: "HGBA1C"  CBG: No results for input(s): "GLUCAP" in the last 168 hours.  Review of Systems:   Shortness of breath, cough  Past Medical History:  She,  has no past medical history on file.   Surgical History:  History reviewed. No pertinent surgical history.   Social History:   reports that she  has been smoking cigarettes. She has a 2.50 pack-year smoking history. She does not have any smokeless tobacco history on file. She reports that she does not drink alcohol.   Family History:  Her family history is not on file.   Allergies No Known Allergies   The patient is critically ill with multiple organ systems failure and requires high complexity decision making for assessment and support, frequent evaluation and titration of therapies, application of advanced monitoring technologies and extensive interpretation of multiple databases. Critical Care Time devoted to patient care services described in this note independent of APP/resident time (if applicable)  is 31 minutes.   Virl Diamond MD [facility] Personal pager: See Amion If unanswered, please page CCM On-call: #[phone]

## 2022-09-29 NOTE — ED Notes (Signed)
Respiratory present attempting BiPAP

## 2022-09-30 DIAGNOSIS — J189 Pneumonia, unspecified organism: Secondary | ICD-10-CM | POA: Diagnosis present

## 2022-09-30 DIAGNOSIS — J45901 Unspecified asthma with (acute) exacerbation: Secondary | ICD-10-CM | POA: Diagnosis present

## 2022-09-30 DIAGNOSIS — J4551 Severe persistent asthma with (acute) exacerbation: Secondary | ICD-10-CM

## 2022-09-30 HISTORY — DX: Pneumonia, unspecified organism: J18.9

## 2022-09-30 LAB — BASIC METABOLIC PANEL
Anion gap: 6 (ref 5–15)
BUN: 11 mg/dL (ref 6–20)
CO2: 22 mmol/L (ref 22–32)
Calcium: 9 mg/dL (ref 8.9–10.3)
Chloride: 107 mmol/L (ref 98–111)
Creatinine, Ser: 0.76 mg/dL (ref 0.44–1.00)
GFR, Estimated: >60 mL/min (ref >60)
Glucose, Bld: 127 mg/dL — ABNORMAL HIGH (ref 70–99)
Potassium: 4.7 mmol/L (ref 3.5–5.1)
Sodium: 135 mmol/L (ref 135–145)

## 2022-09-30 LAB — CBC
HCT: 28.8 % — ABNORMAL LOW (ref 36.0–46.0)
Hemoglobin: 8.2 g/dL — ABNORMAL LOW (ref 12.0–15.0)
MCH: 22.4 pg — ABNORMAL LOW (ref 26.0–34.0)
MCHC: 28.5 g/dL — ABNORMAL LOW (ref 30.0–36.0)
MCV: 78.7 fL — ABNORMAL LOW (ref 80.0–100.0)
Platelets: 192 10*3/uL (ref 150–400)
RBC: 3.66 MIL/uL — ABNORMAL LOW (ref 3.87–5.11)
RDW: 16.9 % — ABNORMAL HIGH (ref 11.5–15.5)
WBC: 12 10*3/uL — ABNORMAL HIGH (ref 4.0–10.5)
nRBC: 0 % (ref 0.0–0.2)

## 2022-09-30 LAB — MAGNESIUM: Magnesium: 1.7 mg/dL (ref 1.7–2.4)

## 2022-09-30 MED ORDER — AMLODIPINE BESYLATE 5 MG PO TABS
5.0000 mg | ORAL_TABLET | Freq: Every day | ORAL | Status: DC
  Administered 2022-09-30 – 2022-10-02 (×3): 5 mg via ORAL
  Filled 2022-09-30 (×3): qty 1

## 2022-09-30 MED ORDER — METHYLPREDNISOLONE SODIUM SUCC 40 MG IJ SOLR
40.0000 mg | Freq: Two times a day (BID) | INTRAMUSCULAR | Status: AC
  Administered 2022-09-30 – 2022-10-02 (×4): 40 mg via INTRAVENOUS
  Filled 2022-09-30 (×4): qty 1

## 2022-09-30 MED ORDER — LORAZEPAM 2 MG/ML IJ SOLN
1.0000 mg | INTRAMUSCULAR | Status: DC | PRN
  Administered 2022-09-30 (×3): 1.5 mg via INTRAVENOUS
  Administered 2022-10-01 (×2): 2 mg via INTRAVENOUS
  Filled 2022-09-30 (×5): qty 1

## 2022-09-30 NOTE — H&P (Addendum)
NAME:  Michele Collins, MRN:  [id number], DOB:  25-Aug-1986, LOS: 1 ADMISSION DATE:  09/29/2022, CONSULTATION DATE:  09/29/22 REFERRING MD:  Lockie Mola - EM, CHIEF COMPLAINT:  SOB   History of Present Illness:  36 yo hx asthma polysub abuse Patient came in with about 4 to 5-day history of feeling poorly, fever, cough, shortness of breath Recently in ED in October for shortness of breath  History of polysubstance abuse, tobacco abuse, cocaine abuse, history of asthma  Recent drug use  Pertinent  Medical History  History reviewed. No pertinent past medical history.   Significant Hospital Events: Including procedures, antibiotic start and stop dates in addition to other pertinent events   Chest x-ray 09/29/2022 with right lower lobe infiltrate Influenza A+  Interim History / Subjective:    Appears to breath comfortably while asleep  When I woke her, she immediately started breathing mid 20s, and had an audible upper airway wheeze, which improved with pursed lips breathing.   I Personally weaned off O2   Objective   Blood pressure (!) 170/111, pulse (!) 106, temperature 98.8 F (37.1 C), temperature source Oral, resp. rate (!) 30, height 5\' 8"  (1.727 m), weight 72.8 kg, SpO2 100 %.        Intake/Output Summary (Last 24 hours) at 09/30/2022 0917 Last data filed at 09/30/2022 0800 Gross per 24 hour  Intake 2668.89 ml  Output 2000 ml  Net 668.89 ml   Filed Weights   09/29/22 0346 09/29/22 1144 09/30/22 0400  Weight: 72.6 kg 70.8 kg 72.8 kg    Examination: General: Middle aged F in bed, initially asleep and very comfortable appearing. Anxious when awakened   HENT: NCAT pink mm Lungs: When asleep -- diminished basilar sounds, some scattered rhonchi, no accessory use. When awakened --tachypnea and Upper airway wheezing sounds which improve with pursed lips breathing  Cardiovascular: s1s2 no rgm  Abdomen: soft ndnt  Extremities: no acute joint deformity  Neuro: AAOx3  following commands GU: defer  Resolved Hospital Problem list   Hypoxia   Assessment & Plan:   Flu A infection  RLL CAP vs Flu A PNA Asthma with possible acute exacerbation  Anxiety ?VCD  Tobacco use disorder Substance use disorder Hypertensive -On last admission when I saw this pt, had concerns for VCD based on exam. This admission, saw pt asleep then awake and it is notable that upper airway noise was not present asleep, is immediately present when pt awakens, and almost entirely resolves with pursed lips breathing. Associated anxiety.   -weaned off O2 with SpO2 100% on RA  P -cont tamiflu. Also -cont steroids, nebs  -increasing PRN anxiolytic  -? Possible utility for amb. ENT referral?  -dc mIVF  -robitussin -tobacco and cocaine cessation counseling  -starting norvasc    Stable to transfer out of ICU   Best Practice (right click and "Reselect all SmartList Selections" daily)   Diet/type: Regular consistency (see orders) DVT prophylaxis: LMWH GI prophylaxis: N/A Lines: N/A Foley:  N/A Code Status:  full code Last date of multidisciplinary goals of care discussion [pending]  Labs   CBC: Recent Labs  Lab 09/29/22 0345 09/29/22 1005  WBC 7.4 7.4  HGB 9.1* 8.1*  HCT 31.5* 28.3*  MCV 77.6* 78.2*  PLT 195 186    Basic Metabolic Panel: Recent Labs  Lab 09/29/22 0345 09/29/22 1005  NA 135  --   K 4.0  --   CL 106  --   CO2 21*  --  GLUCOSE 127*  --   BUN 9  --   CREATININE 0.83 0.78  CALCIUM 8.7*  --    GFR: Estimated Creatinine Clearance: 99 mL/min (by C-G formula based on SCr of 0.78 mg/dL). Recent Labs  Lab 09/29/22 0345 09/29/22 0351 09/29/22 1005  WBC 7.4  --  7.4  LATICACIDVEN  --  1.4  --     Liver Function Tests: No results for input(s): "AST", "ALT", "ALKPHOS", "BILITOT", "PROT", "ALBUMIN" in the last 168 hours. No results for input(s): "LIPASE", "AMYLASE" in the last 168 hours. No results for input(s): "AMMONIA" in the last 168  hours.  ABG    Component Value Date/Time   HCO3 14.1 (L) 09/29/2022 0653   ACIDBASEDEF 10.6 (H) 09/29/2022 0653   O2SAT 99.2 09/29/2022 0653     Coagulation Profile: No results for input(s): "INR", "PROTIME" in the last 168 hours.  Cardiac Enzymes: No results for input(s): "CKTOTAL", "CKMB", "CKMBINDEX", "TROPONINI" in the last 168 hours.  HbA1C: No results found for: "HGBA1C"  CBG: No results for input(s): "GLUCAP" in the last 168 hours.  CCT: na  Tessie Fass MSN, AGACNP-BC [facility] Pulmonary/Critical Care Medicine Amion for pager  09/30/2022, 9:17 AM

## 2022-09-30 NOTE — TOC Initial Note (Signed)
Transition of Care [facility]) - Initial/Assessment Note    Patient Details  Name: Michele Collins MRN: [id number] Date of Birth: [birth year]  [facility] CM/SW Contact:    Lavenia Atlas, RN Phone Number: 09/30/2022, 5:59 PM  Clinical Narrative:     Patient from home with parents, no current TOC needs.  TOC will continue to follow for discharge needs                Barriers to Discharge: Continued Medical Work up   Patient Goals and CMS Choice        Expected Discharge Plan and Services   In-house Referral: NA Discharge Planning Services: CM Consult   Living arrangements for the past 2 months: Single Family Home                 DME Arranged: N/A DME Agency: NA       HH Arranged: NA HH Agency: NA        Prior Living Arrangements/Services Living arrangements for the past 2 months: Single Family Home Lives with:: Parents                   Activities of Daily Living Home Assistive Devices/Equipment: None ADL Screening (condition at time of admission) Patient's cognitive ability adequate to safely complete daily activities?: Yes Is the patient deaf or have difficulty hearing?: No Does the patient have difficulty seeing, even when wearing glasses/contacts?: No Does the patient have difficulty concentrating, remembering, or making decisions?: No Patient able to express need for assistance with ADLs?: Yes Does the patient have difficulty dressing or bathing?: No Independently performs ADLs?: Yes (appropriate for developmental age) Does the patient have difficulty walking or climbing stairs?: No Weakness of Legs: None Weakness of Arms/Hands: None  Permission Sought/Granted                  Emotional Assessment              Admission diagnosis:  Influenza A [J10.1] Acute respiratory failure with hypoxia (HCC) [J96.01] Pneumonia of right lower lobe due to infectious organism [J18.9] Severe asthma with acute exacerbation, unspecified  whether persistent [J45.901] Patient Active Problem List   Diagnosis Date Noted   Pneumonia of right lower lobe due to infectious organism 09/30/2022   Severe persistent asthma with acute exacerbation 09/30/2022   Influenza A 09/29/2022   Respiratory failure (HCC) 07/21/2022   Non-seasonal allergic rhinitis    Iron deficiency anemia 07/20/2022   Tobacco use disorder 07/20/2022   Cocaine abuse (HCC) 07/20/2022   Shortness of breath 07/20/2022   PCP:  Patient, No Pcp Per Pharmacy:   [id number] [place], [place] - [address] AT [facility] [address] [place] [address] Phone: [phone] Fax: [phone]     Social Determinants of Health (SDOH) Interventions    Readmission Risk Interventions     No data to display

## 2022-10-01 DIAGNOSIS — F411 Generalized anxiety disorder: Secondary | ICD-10-CM | POA: Diagnosis present

## 2022-10-01 DIAGNOSIS — E872 Acidosis, unspecified: Secondary | ICD-10-CM

## 2022-10-01 DIAGNOSIS — F419 Anxiety disorder, unspecified: Secondary | ICD-10-CM

## 2022-10-01 DIAGNOSIS — J45901 Unspecified asthma with (acute) exacerbation: Secondary | ICD-10-CM

## 2022-10-01 DIAGNOSIS — D509 Iron deficiency anemia, unspecified: Secondary | ICD-10-CM

## 2022-10-01 DIAGNOSIS — F1410 Cocaine abuse, uncomplicated: Secondary | ICD-10-CM

## 2022-10-01 HISTORY — DX: Acidosis, unspecified: E87.20

## 2022-10-01 LAB — BRAIN NATRIURETIC PEPTIDE: B Natriuretic Peptide: 16 pg/mL (ref 0.0–100.0)

## 2022-10-01 LAB — URINALYSIS, ROUTINE W REFLEX MICROSCOPIC
Bacteria, UA: NONE SEEN
Bilirubin Urine: NEGATIVE
Glucose, UA: NEGATIVE mg/dL
Ketones, ur: NEGATIVE mg/dL
Leukocytes,Ua: NEGATIVE
Nitrite: NEGATIVE
Protein, ur: NEGATIVE mg/dL
Specific Gravity, Urine: 1.018 (ref 1.005–1.030)
pH: 5 (ref 5.0–8.0)

## 2022-10-01 LAB — PROCALCITONIN: Procalcitonin: <0.1 ng/mL

## 2022-10-01 LAB — C-REACTIVE PROTEIN: CRP: 0.5 mg/dL (ref <1.0)

## 2022-10-01 LAB — LACTIC ACID, PLASMA
Lactic Acid, Venous: 2.4 mmol/L (ref 0.5–1.9)
Lactic Acid, Venous: 1.5 mmol/L (ref 0.5–1.9)

## 2022-10-01 LAB — RAPID URINE DRUG SCREEN, HOSP PERFORMED
Amphetamines: NOT DETECTED
Barbiturates: NOT DETECTED
Benzodiazepines: POSITIVE — AB
Cocaine: POSITIVE — AB
Opiates: NOT DETECTED
Tetrahydrocannabinol: NOT DETECTED

## 2022-10-01 LAB — TYPE AND SCREEN
ABO/RH(D): B POS
Antibody Screen: NEGATIVE

## 2022-10-01 LAB — ABO/RH: ABO/RH(D): B POS

## 2022-10-01 LAB — HIV ANTIBODY (ROUTINE TESTING W REFLEX): HIV Screen 4th Generation wRfx: NONREACTIVE

## 2022-10-01 LAB — D-DIMER, QUANTITATIVE: D-Dimer, Quant: 0.44 ug/mL-FEU (ref 0.00–0.50)

## 2022-10-01 LAB — TROPONIN I (HIGH SENSITIVITY): Troponin I (High Sensitivity): 2 ng/L (ref <18)

## 2022-10-01 MED ORDER — LORAZEPAM 1 MG PO TABS
1.0000 mg | ORAL_TABLET | ORAL | Status: DC | PRN
  Administered 2022-10-01: 3 mg via ORAL
  Administered 2022-10-01: 2 mg via ORAL
  Administered 2022-10-02: 1 mg via ORAL
  Filled 2022-10-01: qty 1
  Filled 2022-10-01: qty 3
  Filled 2022-10-01: qty 2

## 2022-10-01 MED ORDER — SODIUM CHLORIDE 0.9 % IV SOLN
500.0000 mg | INTRAVENOUS | Status: DC
  Administered 2022-10-01: 500 mg via INTRAVENOUS
  Filled 2022-10-01: qty 5

## 2022-10-01 MED ORDER — THIAMINE HCL 100 MG/ML IJ SOLN: 100.0000 mg | Freq: Every day | INTRAMUSCULAR | Status: DC

## 2022-10-01 MED ORDER — LACTATED RINGERS IV BOLUS
1000.0000 mL | Freq: Once | INTRAVENOUS | Status: AC
  Administered 2022-10-01: 1000 mL via INTRAVENOUS

## 2022-10-01 MED ORDER — LORAZEPAM 2 MG/ML IJ SOLN
1.0000 mg | Freq: Once | INTRAMUSCULAR | Status: AC
  Administered 2022-10-01: 1 mg via INTRAVENOUS
  Filled 2022-10-01: qty 1

## 2022-10-01 MED ORDER — PREDNISONE 20 MG PO TABS
50.0000 mg | ORAL_TABLET | Freq: Every day | ORAL | Status: DC
  Administered 2022-10-02 – 2022-10-03 (×2): 50 mg via ORAL
  Filled 2022-10-01 (×2): qty 1

## 2022-10-01 MED ORDER — THIAMINE MONONITRATE 100 MG PO TABS
100.0000 mg | ORAL_TABLET | Freq: Every day | ORAL | Status: DC
  Administered 2022-10-01 – 2022-10-03 (×3): 100 mg via ORAL
  Filled 2022-10-01 (×3): qty 1

## 2022-10-01 MED ORDER — LORAZEPAM 2 MG/ML IJ SOLN: 1.0000 mg | INTRAMUSCULAR | Status: DC | PRN

## 2022-10-01 MED ORDER — CHLORHEXIDINE GLUCONATE CLOTH 2 % EX PADS
6.0000 | MEDICATED_PAD | Freq: Every day | CUTANEOUS | Status: DC
  Administered 2022-10-01: 6 via TOPICAL

## 2022-10-01 MED ORDER — FOLIC ACID 1 MG PO TABS
1.0000 mg | ORAL_TABLET | Freq: Every day | ORAL | Status: DC
  Administered 2022-10-01 – 2022-10-03 (×3): 1 mg via ORAL
  Filled 2022-10-01 (×3): qty 1

## 2022-10-01 MED ORDER — ADULT MULTIVITAMIN W/MINERALS CH
1.0000 | ORAL_TABLET | Freq: Every day | ORAL | Status: DC
  Administered 2022-10-01 – 2022-10-03 (×3): 1 via ORAL
  Filled 2022-10-01 (×3): qty 1

## 2022-10-01 MED ORDER — SODIUM CHLORIDE 0.9 % IV SOLN
2.0000 g | INTRAVENOUS | Status: DC
  Administered 2022-10-01 – 2022-10-03 (×3): 2 g via INTRAVENOUS
  Filled 2022-10-01 (×3): qty 20

## 2022-10-01 NOTE — Assessment & Plan Note (Signed)
Substantial wheezing on examination consistent with asthma exacerbation Continue aggressive bronchodilator therapy Systemic steroids Remainder of assessment and plan as above

## 2022-10-01 NOTE — Progress Notes (Signed)
PROGRESS NOTE   Michele Collins  ZOX:[id number] DOB: 1985-10-25 DOA: 09/29/2022 PCP: Patient, No Pcp Per   Date of Service: the patient was seen and examined on 10/01/2022  Brief Narrative:  36 year old female with past medical history of polysubstance abuse including cocaine and marijuana anxiety disorder and asthma who presented to [facility] emergency department with complaints of shortness of breath, malaise and cough.  Upon evaluation in the emergency department patient was found to be influenza A positive.  Clinically there was significant concern for rapid respiratory compromise and therefore PCCM was the initial service to admit the patient to the intensive care unit due to concerns for possible need for intubation.  Patient was initiated on Tamiflu, steroids and bronchodilator therapy.  In the 24 hours that followed patient's respiratory status stabilized as did the patient's oxygen requirement.  Patient was signed out to the hospital service on 12/15.     Assessment and Plan: * Influenza A Patient initially presenting with malaise shortness of breath and severe wheezing Shortness of breath and wheezing seem to be slightly improved today Patient currently not hypoxic and on room air Symptoms likely secondary to combination of influenza A and possible right basilar pneumonia Continue Tamiflu Treating possible concurrent pneumonia with intravenous ceftriaxone and azithromycin Supplemental oxygen as necessary for bouts of hypoxia Managing associated asthma and with systemic steroids and bronchodilator therapy.  Pneumonia of right lower lobe due to infectious organism Substantial right lower lobe infiltrate noted on chest x-ray Procalcitonin unremarkable suggesting this may all be pneumonia due to influenza however considering  severity of illness will treat with intravenous antibiotics for now Blood cultures obtained Remainder of assessment and plan as  above  Lactic acidosis Lactic acidosis likely secondary to sepsis with concurrent volume depletion Hydrate patient intravenous isotonic fluids while concurrently treating underlying infection Monitoring serial lactic acid levels to ensure downtrending and resolution.   Acute asthma exacerbation Substantial wheezing on examination consistent with asthma exacerbation Continue aggressive bronchodilator therapy Systemic steroids Remainder of assessment and plan as above  Cocaine abuse (HCC) Longstanding history of cocaine abuse and frequent marijuana use Patient extremely agitated and tremulous at this point bringing about concerns for possible cocaine withdrawal however patient is tachycardic which is not consistent with this type of presentation Counseling on cessation Obtaining urine toxicology screen Patient denies regular alcohol use  Anxiety disorder Patient exhibiting a labile affect with bouts of tearfulness throughout the interview Patient reports a longstanding history of multiple traumatic events and apparently has never sought mental health treatment Patient exhibits no suicidal or homicidal ideation at this time. Patient is so anxious however I feel psychiatry consultation may be beneficial during this hospitalization.  Microcytic anemia Unclear etiology No clinical evidence of bleeding Iron panel, folate, vitamin B12 unremarkable Monitoring hemoglobin and hematocrit with serial CBCs        Subjective:  Patient complaining of extreme anxiety.  Patient continues to complain of shortness of breath although she states this is improved compared to yesterday.  Physical Exam:  Vitals:   10/01/22 1700 10/01/22 1800 10/01/22 1930 10/01/22 1935  BP:  (!) 160/90 (!) 147/75   Pulse:  (!) 115 (!) 102   Resp:  20 (!) 35   Temp: 98.1 F (36.7 C)     TempSrc: Oral     SpO2:  96% 96% 98%  Weight:      Height:        Constitutional: Awake alert and oriented x3,  patient is extremely  anxious and visibly in distress Skin: no rashes, no lesions, good skin turgor noted. Eyes: Pupils are equally reactive to light.  No evidence of scleral icterus or conjunctival pallor.  ENMT: Moist mucous membranes noted.  Posterior pharynx clear of any exudate or lesions.   Respiratory: Notable expiratory wheezing in all fields, increased respiratory effort without accessory muscle use.  Cardiovascular: Tachycardic rate with a Giller rhythm no murmurs / rubs / gallops. No extremity edema. 2+ pedal pulses. No carotid bruits.  Abdomen: Abdomen is soft and nontender.  No evidence of intra-abdominal masses.  Positive bowel sounds noted in all quadrants.   Musculoskeletal: No joint deformity upper and lower extremities. Good ROM, no contractures. Normal muscle tone.  Psych: Visibly anxious throughout the examination with labile affect.  Multiple episodes of tearfulness throughout the interview.  Patient reporting extremely traumatic events over the past several years that she blames for her intense ongoing anxiety.   Data Reviewed:  I have personally reviewed and interpreted labs, imaging.  Significant findings are   CBC: Recent Labs  Lab 09/29/22 0345 09/29/22 1005 09/30/22 0938  WBC 7.4 7.4 12.0*  HGB 9.1* 8.1* 8.2*  HCT 31.5* 28.3* 28.8*  MCV 77.6* 78.2* 78.7*  PLT 195 186 192   Basic Metabolic Panel: Recent Labs  Lab 09/29/22 0345 09/29/22 1005 09/30/22 0938  NA 135  --  135  K 4.0  --  4.7  CL 106  --  107  CO2 21*  --  22  GLUCOSE 127*  --  127*  BUN 9  --  11  CREATININE 0.83 0.78 0.76  CALCIUM 8.7*  --  9.0  MG  --   --  1.7     Telemetry: Personally reviewed.  Rhythm is sinus tachycardia with heart rate of 120 bpm.  No dynamic ST segment changes appreciated.   Code Status:  Full code.  Code status decision has been confirmed with: patient     Severity of Illness:  The appropriate patient status for this patient is INPATIENT. Inpatient  status is judged to be reasonable and necessary in order to provide the required intensity of service to ensure the patient's safety. The patient's presenting symptoms, physical exam findings, and initial radiographic and laboratory data in the context of their chronic comorbidities is felt to place them at high risk for further clinical deterioration. Furthermore, it is not anticipated that the patient will be medically stable for discharge from the hospital within 2 midnights of admission.   * I certify that at the point of admission it is my clinical judgment that the patient will require inpatient hospital care spanning beyond 2 midnights from the point of admission due to high intensity of service, high risk for further deterioration and high frequency of surveillance required.*  Time spent:  60 minutes  Author:  Marinda Elk MD  10/01/2022 7:53 PM

## 2022-10-01 NOTE — Assessment & Plan Note (Signed)
Psych has evaluated the patient at the bedside feels the patient is suffering from PTSD and generalized anxiety disorder, their input is appreciated They recommend sertraline 25 mg by mouth once daily Outpatient mental health follow-up

## 2022-10-01 NOTE — Assessment & Plan Note (Signed)
   Resolved with intravenous volume resuscitation 

## 2022-10-01 NOTE — Assessment & Plan Note (Addendum)
right lower lobe infiltrate noted on chest x-ray Continue intravenous ceftriaxone and azithromycin Blood cultures without growth Remainder of assessment and plan as above

## 2022-10-01 NOTE — Hospital Course (Signed)
36 year old female with past medical history of polysubstance abuse including cocaine and marijuana anxiety disorder and asthma who presented to [facility] emergency department with complaints of shortness of breath, malaise and cough.  Upon evaluation in the emergency department patient was found to be influenza A positive.  Clinically there was significant concern for rapid respiratory compromise and therefore PCCM was the initial service to admit the patient to the intensive care unit due to concerns for possible need for intubation.  Patient was initiated on Tamiflu, steroids and bronchodilator therapy.  In the 24 hours that followed patient's respiratory status stabilized as did the patient's oxygen requirement.  Patient was signed out to the hospital service on 12/15.  Patient was initiated on intravenous antibiotic therapy due to concerns for concurrent bacterial pneumonia.  In the days that followed patient's respiratory condition slowly clinically improved with continued bronchodilator therapy and systemic steroids.  Of note the patient was found to be substantially anemia during this hospitalization with iron panel suggestive of the anemia of chronic disease.  Patient was given 2 rounds of intravenous iron supplementation and arrangements were made for the patient to go home on oral supplementation daily at time of discharge.  Due to patient's ongoing extreme anxiety throughout the hospitalization a psychiatry consultation was obtained.  Patient was diagnosed with generalized anxiety disorder and PTSD and was placed on sertraline 25 mg daily.  Patient was additionally instructed to follow-up as an outpatient with a new mental health provider.  Concerning patient's ongoing cocaine abuse patient was counseled daily on cessation.  Patient was also notably hypertensive throughout the hospitalization was therefore placed on amlodipine daily.  Due to patient's slow clinical improvement  patient was eventually discharged home with bronchodilator therapy, systemic steroids and the remaining course of antibiotics.  Patient was discharged home in improved and stable condition on 10/03/2022.

## 2022-10-01 NOTE — Assessment & Plan Note (Signed)
Slow clinical improvement Attempting to wean supplemental oxygen Symptoms likely secondary to combination of influenza A and possible right basilar pneumonia Continue Tamiflu Treating possible concurrent pneumonia with intravenous ceftriaxone and azithromycin Managing associated asthma and with systemic steroids and bronchodilator therapy.

## 2022-10-01 NOTE — Assessment & Plan Note (Signed)
Unclear etiology No clinical evidence of bleeding Iron panel, folate, vitamin B12 unremarkable Monitoring hemoglobin and hematocrit with serial CBCs

## 2022-10-01 NOTE — Assessment & Plan Note (Signed)
Longstanding history of cocaine abuse and frequent marijuana use Patient extremely agitated and tremulous at this point bringing about concerns for possible cocaine withdrawal however patient is tachycardic which is not consistent with this type of presentation Counseling on cessation Obtaining urine toxicology screen Patient denies regular alcohol use

## 2022-10-02 DIAGNOSIS — F4310 Post-traumatic stress disorder, unspecified: Secondary | ICD-10-CM

## 2022-10-02 DIAGNOSIS — F411 Generalized anxiety disorder: Secondary | ICD-10-CM

## 2022-10-02 LAB — CBC WITH DIFFERENTIAL/PLATELET
Abs Immature Granulocytes: 0.02 10*3/uL (ref 0.00–0.07)
Basophils Absolute: 0 10*3/uL (ref 0.0–0.1)
Basophils Relative: 0 %
Eosinophils Absolute: 0 10*3/uL (ref 0.0–0.5)
Eosinophils Relative: 0 %
HCT: 25 % — ABNORMAL LOW (ref 36.0–46.0)
Hemoglobin: 7.3 g/dL — ABNORMAL LOW (ref 12.0–15.0)
Immature Granulocytes: 0 %
Lymphocytes Relative: 18 %
Lymphs Abs: 1.1 10*3/uL (ref 0.7–4.0)
MCH: 22.4 pg — ABNORMAL LOW (ref 26.0–34.0)
MCHC: 29.2 g/dL — ABNORMAL LOW (ref 30.0–36.0)
MCV: 76.7 fL — ABNORMAL LOW (ref 80.0–100.0)
Monocytes Absolute: 0.6 10*3/uL (ref 0.1–1.0)
Monocytes Relative: 10 %
Neutro Abs: 4.2 10*3/uL (ref 1.7–7.7)
Neutrophils Relative %: 72 %
Platelets: 193 10*3/uL (ref 150–400)
RBC: 3.26 MIL/uL — ABNORMAL LOW (ref 3.87–5.11)
RDW: 16.9 % — ABNORMAL HIGH (ref 11.5–15.5)
WBC: 5.9 10*3/uL (ref 4.0–10.5)
nRBC: 0 % (ref 0.0–0.2)

## 2022-10-02 LAB — COMPREHENSIVE METABOLIC PANEL
ALT: 16 U/L (ref 0–44)
AST: 17 U/L (ref 15–41)
Albumin: 2.9 g/dL — ABNORMAL LOW (ref 3.5–5.0)
Alkaline Phosphatase: 33 U/L — ABNORMAL LOW (ref 38–126)
Anion gap: 8 (ref 5–15)
BUN: 16 mg/dL (ref 6–20)
CO2: 24 mmol/L (ref 22–32)
Calcium: 8.6 mg/dL — ABNORMAL LOW (ref 8.9–10.3)
Chloride: 105 mmol/L (ref 98–111)
Creatinine, Ser: 0.63 mg/dL (ref 0.44–1.00)
GFR, Estimated: >60 mL/min (ref >60)
Glucose, Bld: 91 mg/dL (ref 70–99)
Potassium: 3.8 mmol/L (ref 3.5–5.1)
Sodium: 137 mmol/L (ref 135–145)
Total Bilirubin: 0.1 mg/dL — ABNORMAL LOW (ref 0.3–1.2)
Total Protein: 7 g/dL (ref 6.5–8.1)

## 2022-10-02 LAB — MAGNESIUM: Magnesium: 2.1 mg/dL (ref 1.7–2.4)

## 2022-10-02 MED ORDER — SODIUM CHLORIDE 0.9 % IV SOLN
250.0000 mg | Freq: Every day | INTRAVENOUS | Status: AC
  Administered 2022-10-02 – 2022-10-03 (×2): 250 mg via INTRAVENOUS
  Filled 2022-10-02: qty 15
  Filled 2022-10-02: qty 20

## 2022-10-02 MED ORDER — LORAZEPAM 1 MG PO TABS
1.0000 mg | ORAL_TABLET | Freq: Four times a day (QID) | ORAL | Status: DC | PRN
  Administered 2022-10-02: 1 mg via ORAL
  Filled 2022-10-02: qty 1

## 2022-10-02 MED ORDER — SODIUM CHLORIDE 0.9 % IV SOLN: INTRAVENOUS | Status: DC | PRN

## 2022-10-02 NOTE — Progress Notes (Addendum)
PROGRESS NOTE   Michele Collins  NWG:[id number] DOB: 1986-08-19 DOA: 09/29/2022 PCP: Patient, No Pcp Per   Date of Service: the patient was seen and examined on 10/02/2022  Brief Narrative:  36 year old female with past medical history of polysubstance abuse including cocaine and marijuana anxiety disorder and asthma who presented to [facility] emergency department with complaints of shortness of breath, malaise and cough.  Upon evaluation in the emergency department patient was found to be influenza A positive.  Clinically there was significant concern for rapid respiratory compromise and therefore PCCM was the initial service to admit the patient to the intensive care unit due to concerns for possible need for intubation.  Patient was initiated on Tamiflu, steroids and bronchodilator therapy.  In the 24 hours that followed patient's respiratory status stabilized as did the patient's oxygen requirement.  Patient was signed out to the hospital service on 12/15.     Assessment and Plan: * Influenza A Slow clinical improvement Attempting to wean supplemental oxygen Symptoms likely secondary to combination of influenza A and possible right basilar pneumonia Continue Tamiflu Treating possible concurrent pneumonia with intravenous ceftriaxone and azithromycin Managing associated asthma and with systemic steroids and bronchodilator therapy.  Pneumonia of right lower lobe due to infectious organism right lower lobe infiltrate noted on chest x-ray Continue intravenous ceftriaxone and azithromycin Blood cultures without growth Remainder of assessment and plan as above  Lactic acidosis Resolved with intravenous volume resuscitation    Acute asthma exacerbation Substantial wheezing on examination consistent with asthma exacerbation Continue aggressive bronchodilator therapy Systemic steroids Remainder of assessment and plan as above  Cocaine abuse (HCC) Longstanding  history of cocaine abuse and frequent marijuana use Improving anxiety and tremors. Counseling on cessation Urine toxicology screen positive for benzodiazepines and cocaine Patient denies regular alcohol use  Generalized anxiety disorder Psych has evaluated the patient at the bedside feels the patient is suffering from PTSD and generalized anxiety disorder, their input is appreciated They recommend sertraline 25 mg by mouth once daily Outpatient mental health follow-up  Microcytic anemia Likely secondary to abnormal uterine bleeding Providing patient with intravenous Ferrlecit No clinical evidence of bleeding Iron panel, folate, vitamin B12 unremarkable Monitoring hemoglobin and hematocrit with serial CBCs  PTSD (post-traumatic stress disorder) Plan as above    Subjective:  Patient states that her anxiety is somewhat improved compared to yesterday.  Patient shortness of breath persists but is slightly improved compared to yesterday.  Patient complains of associated generalized weakness.    Physical Exam:  Vitals:   10/02/22 1525 10/02/22 1600 10/02/22 1700 10/02/22 1720  BP:  (!) 149/100    Pulse:  (!) 115 (!) 110   Resp:  (!) 25 (!) 27   Temp:    98.1 F (36.7 C)  TempSrc:    Oral  SpO2: 92% 94% 96%   Weight:      Height:        Constitutional: Awake alert and oriented x3, patient is extremely anxious however less so than yesterday. Skin: no rashes, no lesions, good skin turgor noted. Eyes: Pupils are equally reactive to light.  No evidence of scleral icterus or conjunctival pallor.  ENMT: Moist mucous membranes noted.  Posterior pharynx clear of any exudate or lesions.   Respiratory: Notable expiratory wheezing in all fields, increased respiratory effort without accessory muscle use.  Cardiovascular: Tachycardic rate with regular rhythm no murmurs / rubs / gallops. No extremity edema. 2+ pedal pulses. No carotid bruits.  Abdomen: Abdomen is  soft and nontender.  No  evidence of intra-abdominal masses.  Positive bowel sounds noted in all quadrants.   Musculoskeletal: No joint deformity upper and lower extremities. Good ROM, no contractures. Normal muscle tone.  Psych: Visibly anxious throughout the examination albeit less so compared to yesterday.   Data Reviewed:  I have personally reviewed and interpreted labs, imaging.  Significant findings are   CBC: Recent Labs  Lab 09/29/22 0345 09/29/22 1005 09/30/22 0938 10/02/22 0251  WBC 7.4 7.4 12.0* 5.9  NEUTROABS  --   --   --  4.2  HGB 9.1* 8.1* 8.2* 7.3*  HCT 31.5* 28.3* 28.8* 25.0*  MCV 77.6* 78.2* 78.7* 76.7*  PLT 195 186 192 0000000   Basic Metabolic Panel: Recent Labs  Lab 09/29/22 0345 09/29/22 1005 09/30/22 0938 10/02/22 0602  NA 135  --  135 137  K 4.0  --  4.7 3.8  CL 106  --  107 105  CO2 21*  --  22 24  GLUCOSE 127*  --  127* 91  BUN 9  --  11 16  CREATININE 0.83 0.78 0.76 0.63  CALCIUM 8.7*  --  9.0 8.6*  MG  --   --  1.7 2.1     Telemetry: Personally reviewed.  Rhythm is sinus tachycardia with heart rate of 105 bpm.  No dynamic ST segment changes appreciated.   Code Status:  Full code.  Code status decision has been confirmed with: patient     Severity of Illness:  The appropriate patient status for this patient is INPATIENT. Inpatient status is judged to be reasonable and necessary in order to provide the required intensity of service to ensure the patient's safety. The patient's presenting symptoms, physical exam findings, and initial radiographic and laboratory data in the context of their chronic comorbidities is felt to place them at high risk for further clinical deterioration. Furthermore, it is not anticipated that the patient will be medically stable for discharge from the hospital within 2 midnights of admission.   * I certify that at the point of admission it is my clinical judgment that the patient will require inpatient hospital care spanning beyond 2  midnights from the point of admission due to high intensity of service, high risk for further deterioration and high frequency of surveillance required.*  Time spent:  50 minutes  Author:  Vernelle Emerald MD  10/02/2022 5:45 PM
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
## 2022-10-02 NOTE — TOC Initial Note (Addendum)
Transition of Care [facility]) - Initial/Assessment Note    Patient Details  Name: Michele Collins MRN: [id number] Date of Birth: 04/10/1986  Transition of Care [facility]) CM/SW Contact:    Adrian Prows, RN Phone Number: 10/02/2022, 5:04 PM  Clinical Narrative:                 [facility] [place] consult for SA counseling/education; also noted pt does not have PCP listed; spoke w/ patient and she agrees to resources for SA; the pt also says she does not have a PCP; she says she is not sure if her Medicaid was renewed; she says someone will be paying for any medications that she has at d/c; she also says that she does not work; the pt says she lives w/ her SO and plans to return home at d/c; she identifies her POC Cleotis Nipper [phone]); she says she has transportation; the pt says she does not have dentures, glasses or hearing aids; she also says she does not have DME or HH services; the pt denies IPV, food insecurity, and problems paying for utilities; she agrees to receiving resource for community clinics and social services; resources for social services and SA counseling/education placed in d/c instructions; contact information for community clinics given to pt's RN, [place] for pt; she will make her own appts for resources; TOC will con't to follow for d/c needs.   Expected Discharge Plan: Home/Self Care Barriers to Discharge: Continued Medical Work up   Patient Goals and CMS Choice Patient states their goals for this hospitalization and ongoing recovery are:: home      Expected Discharge Plan and Services Expected Discharge Plan: Home/Self Care In-house Referral: NA Discharge Planning Services: CM Consult   Living arrangements for the past 2 months: Apartment                 DME Arranged: N/A DME Agency: NA       HH Arranged: NA HH Agency: NA        Prior Living Arrangements/Services Living arrangements for the past 2 months: Apartment Lives with:: Significant Other Patient  language and need for interpreter reviewed:: Yes Do you feel safe going back to the place where you live?: Yes      Need for Family Participation in Patient Care: Yes (Comment) Care giver support system in place?: Yes (comment)   Criminal Activity/Legal Involvement Pertinent to Current Situation/Hospitalization: No - Comment as needed  Activities of Daily Living Home Assistive Devices/Equipment: None ADL Screening (condition at time of admission) Patient's cognitive ability adequate to safely complete daily activities?: Yes Is the patient deaf or have difficulty hearing?: No Does the patient have difficulty seeing, even when wearing glasses/contacts?: No Does the patient have difficulty concentrating, remembering, or making decisions?: No Patient able to express need for assistance with ADLs?: Yes Does the patient have difficulty dressing or bathing?: No Independently performs ADLs?: Yes (appropriate for developmental age) Does the patient have difficulty walking or climbing stairs?: No Weakness of Legs: None Weakness of Arms/Hands: None  Permission Sought/Granted Permission sought to share information with : Case Manager Permission granted to share information with : Yes, Verbal Permission Granted  Share Information with NAME: Burnard Bunting, RN, CM           Emotional Assessment Appearance:: Appears stated age Attitude/Demeanor/Rapport: Gracious Affect (typically observed): Accepting Orientation: : Oriented to Self, Oriented to Place, Oriented to  Time, Oriented to Situation Alcohol / Substance Use: Illicit Drugs Psych Involvement: No (comment)  Admission diagnosis:  Influenza A [J10.1] Acute respiratory failure with hypoxia (HCC) [J96.01] Pneumonia of right lower lobe due to infectious organism [J18.9] Severe asthma with acute exacerbation, unspecified whether persistent [J45.901] Patient Active Problem List   Diagnosis Date Noted   Lactic acidosis 10/01/2022   Anxiety  disorder 10/01/2022   Pneumonia of right lower lobe due to infectious organism 09/30/2022   Acute asthma exacerbation 09/30/2022   Influenza A 09/29/2022   Microcytic anemia 07/20/2022   Tobacco use disorder 07/20/2022   Cocaine abuse (HCC) 07/20/2022   PCP:  Patient, No Pcp Per Pharmacy:   [id number] [place], [place] - [address] AT [facility] OF [address] [place] [place] [address] Phone: [phone] Fax: [phone]     Social Determinants of Health (SDOH) Interventions    Readmission Risk Interventions     No data to display

## 2022-10-02 NOTE — Assessment & Plan Note (Signed)
Plan as above.  

## 2022-10-03 LAB — CBC WITH DIFFERENTIAL/PLATELET
Abs Immature Granulocytes: 0.04 10*3/uL (ref 0.00–0.07)
Basophils Absolute: 0 10*3/uL (ref 0.0–0.1)
Basophils Relative: 0 %
Eosinophils Absolute: 0 10*3/uL (ref 0.0–0.5)
Eosinophils Relative: 0 %
HCT: 26.7 % — ABNORMAL LOW (ref 36.0–46.0)
Hemoglobin: 7.6 g/dL — ABNORMAL LOW (ref 12.0–15.0)
Immature Granulocytes: 1 %
Lymphocytes Relative: 27 %
Lymphs Abs: 1.9 10*3/uL (ref 0.7–4.0)
MCH: 22.4 pg — ABNORMAL LOW (ref 26.0–34.0)
MCHC: 28.5 g/dL — ABNORMAL LOW (ref 30.0–36.0)
MCV: 78.5 fL — ABNORMAL LOW (ref 80.0–100.0)
Monocytes Absolute: 0.7 10*3/uL (ref 0.1–1.0)
Monocytes Relative: 11 %
Neutro Abs: 4.3 10*3/uL (ref 1.7–7.7)
Neutrophils Relative %: 61 %
Platelets: 186 10*3/uL (ref 150–400)
RBC: 3.4 MIL/uL — ABNORMAL LOW (ref 3.87–5.11)
RDW: 16.9 % — ABNORMAL HIGH (ref 11.5–15.5)
WBC: 7 10*3/uL (ref 4.0–10.5)
nRBC: 0 % (ref 0.0–0.2)

## 2022-10-03 LAB — COMPREHENSIVE METABOLIC PANEL
ALT: 17 U/L (ref 0–44)
AST: 16 U/L (ref 15–41)
Albumin: 3.2 g/dL — ABNORMAL LOW (ref 3.5–5.0)
Alkaline Phosphatase: 35 U/L — ABNORMAL LOW (ref 38–126)
Anion gap: 7 (ref 5–15)
BUN: 15 mg/dL (ref 6–20)
CO2: 23 mmol/L (ref 22–32)
Calcium: 9.1 mg/dL (ref 8.9–10.3)
Chloride: 106 mmol/L (ref 98–111)
Creatinine, Ser: 0.63 mg/dL (ref 0.44–1.00)
GFR, Estimated: >60 mL/min (ref >60)
Glucose, Bld: 115 mg/dL — ABNORMAL HIGH (ref 70–99)
Potassium: 3.8 mmol/L (ref 3.5–5.1)
Sodium: 136 mmol/L (ref 135–145)
Total Bilirubin: 0.2 mg/dL — ABNORMAL LOW (ref 0.3–1.2)
Total Protein: 6.9 g/dL (ref 6.5–8.1)

## 2022-10-03 LAB — MAGNESIUM: Magnesium: 2.1 mg/dL (ref 1.7–2.4)

## 2022-10-03 LAB — PHOSPHORUS: Phosphorus: 3.5 mg/dL (ref 2.5–4.6)

## 2022-10-03 MED ORDER — FERROUS SULFATE 325 (65 FE) MG PO TABS: 325.0000 mg | ORAL_TABLET | Freq: Every day | ORAL | 2 refills | Status: DC

## 2022-10-03 MED ORDER — DOXYCYCLINE HYCLATE 100 MG PO TABS: 100.0000 mg | ORAL_TABLET | Freq: Two times a day (BID) | ORAL | 0 refills | Status: AC

## 2022-10-03 MED ORDER — SERTRALINE HCL 25 MG PO TABS: 25.0000 mg | ORAL_TABLET | Freq: Every day | ORAL | 1 refills | Status: DC

## 2022-10-03 MED ORDER — PREDNISONE 50 MG PO TABS: 50.0000 mg | ORAL_TABLET | Freq: Every day | ORAL | 0 refills | Status: AC

## 2022-10-03 MED ORDER — AMLODIPINE BESYLATE 10 MG PO TABS
10.0000 mg | ORAL_TABLET | Freq: Every day | ORAL | Status: DC
  Administered 2022-10-03: 10 mg via ORAL
  Filled 2022-10-03: qty 1

## 2022-10-03 MED ORDER — SERTRALINE HCL 50 MG PO TABS
25.0000 mg | ORAL_TABLET | Freq: Every day | ORAL | Status: DC
  Administered 2022-10-03: 25 mg via ORAL
  Filled 2022-10-03: qty 1

## 2022-10-03 MED ORDER — ALBUTEROL SULFATE HFA 108 (90 BASE) MCG/ACT IN AERS: 2.0000 | INHALATION_SPRAY | RESPIRATORY_TRACT | 1 refills | Status: DC | PRN

## 2022-10-03 MED ORDER — CEFDINIR 300 MG PO CAPS: 300.0000 mg | ORAL_CAPSULE | Freq: Two times a day (BID) | ORAL | 0 refills | Status: AC

## 2022-10-03 MED ORDER — AMLODIPINE BESYLATE 10 MG PO TABS: 10.0000 mg | ORAL_TABLET | Freq: Every day | ORAL | 1 refills | Status: DC

## 2022-10-03 NOTE — Discharge Summary (Signed)
Physician Discharge Summary   Patient: Michele Collins MRN: [id number] DOB: [birth year]  Admit date:     09/29/2022  Discharge date: 10/03/22  Discharge Physician: Marinda Elk   PCP: Patient, No Pcp Per   Recommendations at discharge:   Patient was instructed to take an iron supplement daily for her iron deficiency anemia secondary to abnormal uterine bleeding Patient was instructed to follow-up as an outpatient with her gynecologist concerning her ongoing abnormal uterine bleeding Patient was instructed to follow-up with her primary care provider in 1 to 2 weeks Patient was instructed to follow-up with a new mental health professional concerning her PTSD and generalized anxiety disorder Patient was placed on a number of new medications including an albuterol inhaler, short course of steroids, remaining course of antibiotic therapy, sertraline for her generalized anxiety disorder and amlodipine for her hypertension.  Discharge Diagnoses: Principal Problem:   Influenza A Active Problems:   Pneumonia of right lower lobe due to infectious organism   Lactic acidosis   Acute asthma exacerbation   Cocaine abuse (HCC)   Generalized anxiety disorder   Microcytic anemia   PTSD (post-traumatic stress disorder)  Resolved Problems:   * No resolved hospital problems. *   Hospital Course: 36 year old female with past medical history of polysubstance abuse including cocaine and marijuana anxiety disorder and asthma who presented to [facility] long hospital emergency department with complaints of shortness of breath, malaise and cough.  Upon evaluation in the emergency department patient was found to be influenza A positive.  Clinically there was significant concern for rapid respiratory compromise and therefore PCCM was the initial service to admit the patient to the intensive care unit due to concerns for possible need for intubation.  Patient was initiated on Tamiflu, steroids and  bronchodilator therapy.  In the 24 hours that followed patient's respiratory status stabilized as did the patient's oxygen requirement.  Patient was signed out to the hospital service on 12/15.  Patient was initiated on intravenous antibiotic therapy due to concerns for concurrent bacterial pneumonia.  In the days that followed patient's respiratory condition slowly clinically improved with continued bronchodilator therapy and systemic steroids.  Of note the patient was found to be substantially anemia during this hospitalization with iron panel suggestive of the anemia of chronic disease.  Patient was given 2 rounds of intravenous iron supplementation and arrangements were made for the patient to go home on oral supplementation daily at time of discharge.  Due to patient's ongoing extreme anxiety throughout the hospitalization a psychiatry consultation was obtained.  Patient was diagnosed with generalized anxiety disorder and PTSD and was placed on sertraline 25 mg daily.  Patient was additionally instructed to follow-up as an outpatient with a new mental health provider.  Concerning patient's ongoing cocaine abuse patient was counseled daily on cessation.  Patient was also notably hypertensive throughout the hospitalization was therefore placed on amlodipine daily.  Due to patient's slow clinical improvement patient was eventually discharged home with bronchodilator therapy, systemic steroids and the remaining course of antibiotics.  Patient was discharged home in improved and stable condition on 10/03/2022.      Consultants: Dr. Lenon Ahmadi with Psychiatry.  Dr. Wynona Neat with PCCM Procedures performed: None  Disposition: Home Diet recommendation:  Discharge Diet Orders (From admission, onward)     Start     Ordered   10/03/22 0000  Diet - low sodium heart healthy        10/03/22 1114  Cardiac diet  DISCHARGE MEDICATION: Allergies as of 10/03/2022   No Known Allergies       Medication List     TAKE these medications    albuterol 108 (90 Base) MCG/ACT inhaler Commonly known as: VENTOLIN HFA Inhale 2 puffs into the lungs every 4 (four) hours as needed for wheezing or shortness of breath. What changed: when to take this   amLODipine 10 MG tablet Commonly known as: NORVASC Take 1 tablet (10 mg total) by mouth daily. Start taking on: October 04, 2022   cefdinir 300 MG capsule Commonly known as: OMNICEF Take 1 capsule (300 mg total) by mouth 2 (two) times daily for 3 doses.   doxycycline 100 MG tablet Commonly known as: VIBRA-TABS Take 1 tablet (100 mg total) by mouth every 12 (twelve) hours for 3 doses.   ferrous sulfate 325 (65 FE) MG tablet Take 1 tablet (325 mg total) by mouth daily before breakfast. If able, take with cup of orange juice.   guaiFENesin 600 MG 12 hr tablet Commonly known as: MUCINEX Take 1,200 mg by mouth 2 (two) times daily as needed for cough or to loosen phlegm.   ibuprofen 200 MG tablet Commonly known as: ADVIL Take 400 mg by mouth every 6 (six) hours as needed for mild pain.   predniSONE 50 MG tablet Commonly known as: DELTASONE Take 1 tablet (50 mg total) by mouth daily with breakfast for 2 days. Start taking on: October 04, 2022   sertraline 25 MG tablet Commonly known as: ZOLOFT Take 1 tablet (25 mg total) by mouth daily.        Follow-up Information     [facility]. Schedule an appointment as soon as possible for a visit in 1 week(s).   Contact information: [address] [place] [address] [phone]        [facility] Provider. Schedule an appointment as soon as possible for a visit.                  Discharge Exam: Filed Weights   09/30/22 0400 10/01/22 0447 10/02/22 0300  Weight: 72.8 kg 72 kg 72.6 kg    Constitutional: Awake alert and oriented x3, no associated distress.   Respiratory: Minimal right Tory  wheezing noted without crackles. Normal respiratory effort. No accessory muscle use.  Cardiovascular: Regular rate and rhythm, no murmurs / rubs / gallops. No extremity edema. 2+ pedal pulses. No carotid bruits.  Abdomen: Abdomen is soft and nontender.  No evidence of intra-abdominal masses.  Positive bowel sounds noted in all quadrants.   Musculoskeletal: No joint deformity upper and lower extremities. Good ROM, no contractures. Normal muscle tone.     Condition at discharge: fair  The results of significant diagnostics from this hospitalization (including imaging, microbiology, ancillary and laboratory) are listed below for reference.   Imaging Studies: DG Chest Port 1 View  Result Date: 09/29/2022 CLINICAL DATA:  Dyspnea EXAM: PORTABLE CHEST 1 VIEW COMPARISON:  07/20/2022 FINDINGS: Right basilar focal pulmonary infiltrate is developed, likely infectious in the appropriate clinical setting. No pneumothorax or pleural effusion. Cardiac size within normal limits. Pulmonary vascularity is normal. No acute bone abnormality. IMPRESSION: 1. Right basilar pneumonic infiltrate. Electronically Signed   By: Helyn Numbers M.D.   On: 09/29/2022 04:17    Microbiology: Results for orders placed or performed during the hospital encounter of 09/29/22  Resp panel by RT-PCR (RSV, Flu A&B, Covid) Anterior Nasal Swab  Status: Abnormal   Collection Time: 09/29/22  3:47 AM   Specimen: Anterior Nasal Swab  Result Value Ref Range Status   SARS Coronavirus 2 by RT PCR NEGATIVE NEGATIVE Final    Comment: (NOTE) SARS-CoV-2 target nucleic acids are NOT DETECTED.  The SARS-CoV-2 RNA is generally detectable in upper respiratory specimens during the acute phase of infection. The lowest concentration of SARS-CoV-2 viral copies this assay can detect is 138 copies/mL. A negative result does not preclude SARS-Cov-2 infection and should not be used as the sole basis for treatment or other patient management  decisions. A negative result may occur with  improper specimen collection/handling, submission of specimen other than nasopharyngeal swab, presence of viral mutation(s) within the areas targeted by this assay, and inadequate number of viral copies(<138 copies/mL). A negative result must be combined with clinical observations, patient history, and epidemiological information. The expected result is Negative.  Fact Sheet for Patients:  BloggerCourse.comhttps://www.fda.gov/media/152166/download  Fact Sheet for Healthcare Providers:  SeriousBroker.ithttps://www.fda.gov/media/152162/download  This test is no t yet approved or cleared by the [place] FDA and  has been authorized for detection and/or diagnosis of SARS-CoV-2 by FDA under an Emergency Use Authorization (EUA). This EUA will remain  in effect (meaning this test can be used) for the duration of the COVID-19 declaration under Section 564(b)(1) of the Act, 21 U.S.C.section 360bbb-3(b)(1), unless the authorization is terminated  or revoked sooner.       Influenza A by PCR POSITIVE (A) NEGATIVE Final   Influenza B by PCR NEGATIVE NEGATIVE Final    Comment: (NOTE) The Xpert Xpress SARS-CoV-2/FLU/RSV plus assay is intended as an aid in the diagnosis of influenza from Nasopharyngeal swab specimens and should not be used as a sole basis for treatment. Nasal washings and aspirates are unacceptable for Xpert Xpress SARS-CoV-2/FLU/RSV testing.  Fact Sheet for Patients: BloggerCourse.comhttps://www.fda.gov/media/152166/download  Fact Sheet for Healthcare Providers: SeriousBroker.ithttps://www.fda.gov/media/152162/download  This test is not yet approved or cleared by the [place] FDA and has been authorized for detection and/or diagnosis of SARS-CoV-2 by FDA under an Emergency Use Authorization (EUA). This EUA will remain in effect (meaning this test can be used) for the duration of the COVID-19 declaration under Section 564(b)(1) of the Act, 21 U.S.C. section 360bbb-3(b)(1), unless  the authorization is terminated or revoked.     Resp Syncytial Virus by PCR NEGATIVE NEGATIVE Final    Comment: (NOTE) Fact Sheet for Patients: BloggerCourse.comhttps://www.fda.gov/media/152166/download  Fact Sheet for Healthcare Providers: SeriousBroker.ithttps://www.fda.gov/media/152162/download  This test is not yet approved or cleared by the [place] FDA and has been authorized for detection and/or diagnosis of SARS-CoV-2 by FDA under an Emergency Use Authorization (EUA). This EUA will remain in effect (meaning this test can be used) for the duration of the COVID-19 declaration under Section 564(b)(1) of the Act, 21 U.S.C. section 360bbb-3(b)(1), unless the authorization is terminated or revoked.  Performed at [facility], [address] [place], [place] [address]   Blood culture (routine x 2)     Status: None (Preliminary result)   Collection Time: 09/29/22  4:00 AM   Specimen: BLOOD RIGHT HAND  Result Value Ref Range Status   Specimen Description   Final    BLOOD RIGHT HAND Performed at [facility], [address] [place], [place] [address]    Special Requests   Final    BOTTLES DRAWN AEROBIC AND ANAEROBIC Blood Culture adequate volume Performed at [facility], [address] [place], [place] [address]  Culture   Final    NO GROWTH 4 DAYS Performed at [facility], [address], [place], [place] [address]    Report Status PENDING  Incomplete  Blood culture (routine x 2)     Status: None (Preliminary result)   Collection Time: 09/29/22  4:06 AM   Specimen: BLOOD  Result Value Ref Range Status   Specimen Description   Final    BLOOD RIGHT THUMB Performed at [facility], [address], [place], [place] [address]    Special Requests   Final    BOTTLES DRAWN AEROBIC AND ANAEROBIC Blood Culture adequate volume Performed at [facility], [address], [place], [place] [address]     Culture   Final    NO GROWTH 4 DAYS Performed at [facility], [address], [place], [place] [address]    Report Status PENDING  Incomplete  MRSA Next Gen by PCR, Nasal     Status: None   Collection Time: 09/29/22 11:41 AM   Specimen: Nasal Mucosa; Nasal Swab  Result Value Ref Range Status   MRSA by PCR Next Gen NOT DETECTED NOT DETECTED Final    Comment: (NOTE) The GeneXpert MRSA Assay (FDA approved for NASAL specimens only), is one component of a comprehensive MRSA colonization surveillance program. It is not intended to diagnose MRSA infection nor to guide or monitor treatment for MRSA infections. Test performance is not FDA approved in patients less than 4 years old. Performed at [facility], [address], [place], [place] [address]     Labs: CBC: Recent Labs  Lab 09/29/22 0345 09/29/22 1005 09/30/22 0938 10/02/22 0251 10/03/22 0229  WBC 7.4 7.4 12.0* 5.9 7.0  NEUTROABS  --   --   --  4.2 4.3  HGB 9.1* 8.1* 8.2* 7.3* 7.6*  HCT 31.5* 28.3* 28.8* 25.0* 26.7*  MCV 77.6* 78.2* 78.7* 76.7* 78.5*  PLT 195 186 192 193 186   Basic Metabolic Panel: Recent Labs  Lab 09/29/22 0345 09/29/22 1005 09/30/22 0938 10/02/22 0602 10/03/22 0229  NA 135  --  135 137 136  K 4.0  --  4.7 3.8 3.8  CL 106  --  107 105 106  CO2 21*  --  22 24 23   GLUCOSE 127*  --  127* 91 115*  BUN 9  --  11 16 15   CREATININE 0.83 0.78 0.76 0.63 0.63  CALCIUM 8.7*  --  9.0 8.6* 9.1  MG  --   --  1.7 2.1 2.1  PHOS  --   --   --   --  3.5   Liver Function Tests: Recent Labs  Lab 10/02/22 0602 10/03/22 0229  AST 17 16  ALT 16 17  ALKPHOS 33* 35*  BILITOT 0.1* 0.2*  PROT 7.0 6.9  ALBUMIN 2.9* 3.2*   CBG: No results for input(s): "GLUCAP" in the last 168 hours.  Discharge time spent: greater than 30 minutes.  Signed: 10/04/22, MD [facility] 10/03/2022
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
## 2022-10-03 NOTE — Plan of Care (Signed)

## 2022-10-03 NOTE — Plan of Care (Signed)

## 2022-10-03 NOTE — Discharge Instructions (Addendum)
Please take all medications exactly as instructed Since is the weekend you have been provided with contact information to reach out to some of our local clinics to establish yourself with a new primary care provider.  Please contact them tomorrow and try to get an appointment within the next 1 to 2 weeks for postdischarge follow-up.  Resources have also been provided for you to make a new follow-up appointment with a local behavioral health provider as well. Please abstain from further cocaine use.  Resources have been provided to you by our case management team. Please return to the emergency department if you develop worsening shortness of breath, weakness, inability to tolerate oral intake or fevers in excess of 100.4 F. You were found to have significant iron deficiency anemia thought to be secondary to your heavy menses.  If you continue to experience periods of heavy menstruation, please seek follow-up with your gynecologist as there may be options for you to help reduce this.

## 2022-10-04 LAB — CULTURE, BLOOD (ROUTINE X 2)
Culture: NO GROWTH
Culture: NO GROWTH
Special Requests: ADEQUATE
Special Requests: ADEQUATE

## 2023-04-21 ENCOUNTER — Other Ambulatory Visit: Payer: Self-pay

## 2023-04-21 ENCOUNTER — Inpatient Hospital Stay (HOSPITAL_BASED_OUTPATIENT_CLINIC_OR_DEPARTMENT_OTHER)
Admission: EM | Admit: 2023-04-21 | Discharge: 2023-04-24 | DRG: 193 | Disposition: A | Discharge status: Home / Self Care | Payer: Self-pay | Attending: Internal Medicine | Admitting: Internal Medicine

## 2023-04-21 ENCOUNTER — Encounter (HOSPITAL_BASED_OUTPATIENT_CLINIC_OR_DEPARTMENT_OTHER): Payer: Self-pay

## 2023-04-21 ENCOUNTER — Emergency Department (HOSPITAL_BASED_OUTPATIENT_CLINIC_OR_DEPARTMENT_OTHER): Payer: Self-pay | Admitting: Radiology

## 2023-04-21 ENCOUNTER — Emergency Department (HOSPITAL_BASED_OUTPATIENT_CLINIC_OR_DEPARTMENT_OTHER): Payer: Self-pay

## 2023-04-21 DIAGNOSIS — F17210 Nicotine dependence, cigarettes, uncomplicated: Secondary | ICD-10-CM | POA: Diagnosis present

## 2023-04-21 DIAGNOSIS — Z1152 Encounter for screening for COVID-19: Secondary | ICD-10-CM

## 2023-04-21 DIAGNOSIS — J45909 Unspecified asthma, uncomplicated: Secondary | ICD-10-CM | POA: Insufficient documentation

## 2023-04-21 DIAGNOSIS — F411 Generalized anxiety disorder: Secondary | ICD-10-CM | POA: Diagnosis present

## 2023-04-21 DIAGNOSIS — J45901 Unspecified asthma with (acute) exacerbation: Secondary | ICD-10-CM | POA: Diagnosis present

## 2023-04-21 DIAGNOSIS — F4310 Post-traumatic stress disorder, unspecified: Secondary | ICD-10-CM | POA: Diagnosis present

## 2023-04-21 DIAGNOSIS — J1289 Other viral pneumonia: Principal | ICD-10-CM | POA: Diagnosis present

## 2023-04-21 DIAGNOSIS — F410 Panic disorder [episodic paroxysmal anxiety] without agoraphobia: Secondary | ICD-10-CM | POA: Diagnosis present

## 2023-04-21 DIAGNOSIS — Z79899 Other long term (current) drug therapy: Secondary | ICD-10-CM

## 2023-04-21 DIAGNOSIS — D509 Iron deficiency anemia, unspecified: Secondary | ICD-10-CM | POA: Diagnosis present

## 2023-04-21 DIAGNOSIS — F1410 Cocaine abuse, uncomplicated: Secondary | ICD-10-CM | POA: Diagnosis present

## 2023-04-21 DIAGNOSIS — Z9981 Dependence on supplemental oxygen: Secondary | ICD-10-CM

## 2023-04-21 DIAGNOSIS — I10 Essential (primary) hypertension: Secondary | ICD-10-CM | POA: Diagnosis present

## 2023-04-21 DIAGNOSIS — B9789 Other viral agents as the cause of diseases classified elsewhere: Secondary | ICD-10-CM | POA: Diagnosis present

## 2023-04-21 DIAGNOSIS — J9601 Acute respiratory failure with hypoxia: Secondary | ICD-10-CM | POA: Diagnosis present

## 2023-04-21 DIAGNOSIS — J189 Pneumonia, unspecified organism: Principal | ICD-10-CM

## 2023-04-21 HISTORY — DX: Essential (primary) hypertension: I10

## 2023-04-21 LAB — CBC
HCT: 33.1 % — ABNORMAL LOW (ref 36.0–46.0)
Hemoglobin: 10 g/dL — ABNORMAL LOW (ref 12.0–15.0)
MCH: 24.2 pg — ABNORMAL LOW (ref 26.0–34.0)
MCHC: 30.2 g/dL (ref 30.0–36.0)
MCV: 80.1 fL (ref 80.0–100.0)
Platelets: 208 10*3/uL (ref 150–400)
RBC: 4.13 MIL/uL (ref 3.87–5.11)
RDW: 16.1 % — ABNORMAL HIGH (ref 11.5–15.5)
WBC: 5.6 10*3/uL (ref 4.0–10.5)
nRBC: 0 % (ref 0.0–0.2)

## 2023-04-21 LAB — PROCALCITONIN: Procalcitonin: <0.1 ng/mL

## 2023-04-21 LAB — BLOOD GAS, ARTERIAL
Acid-base deficit: 5 mmol/L — ABNORMAL HIGH (ref 0.0–2.0)
Bicarbonate: 18.6 mmol/L — ABNORMAL LOW (ref 20.0–28.0)
Drawn by: 51155
O2 Saturation: 99.6 %
Patient temperature: 37
pCO2 arterial: 30 mmHg — ABNORMAL LOW (ref 32–48)
pH, Arterial: 7.4 (ref 7.35–7.45)
pO2, Arterial: 131 mmHg — ABNORMAL HIGH (ref 83–108)

## 2023-04-21 LAB — SARS CORONAVIRUS 2 BY RT PCR: SARS Coronavirus 2 by RT PCR: NEGATIVE

## 2023-04-21 LAB — LACTIC ACID, PLASMA
Lactic Acid, Venous: 2.7 mmol/L (ref 0.5–1.9)
Lactic Acid, Venous: 5.1 mmol/L (ref 0.5–1.9)
Lactic Acid, Venous: 2.4 mmol/L (ref 0.5–1.9)

## 2023-04-21 LAB — TROPONIN I (HIGH SENSITIVITY)
Troponin I (High Sensitivity): 18 ng/L — ABNORMAL HIGH (ref <18)
Troponin I (High Sensitivity): 16 ng/L (ref <18)

## 2023-04-21 LAB — BASIC METABOLIC PANEL
Anion gap: 7 (ref 5–15)
BUN: 9 mg/dL (ref 6–20)
CO2: 22 mmol/L (ref 22–32)
Calcium: 9.3 mg/dL (ref 8.9–10.3)
Chloride: 106 mmol/L (ref 98–111)
Creatinine, Ser: 0.69 mg/dL (ref 0.44–1.00)
GFR, Estimated: >60 mL/min (ref >60)
Glucose, Bld: 97 mg/dL (ref 70–99)
Potassium: 3.8 mmol/L (ref 3.5–5.1)
Sodium: 135 mmol/L (ref 135–145)

## 2023-04-21 LAB — BRAIN NATRIURETIC PEPTIDE: B Natriuretic Peptide: 203.6 pg/mL — ABNORMAL HIGH (ref 0.0–100.0)

## 2023-04-21 MED ORDER — IPRATROPIUM-ALBUTEROL 0.5-2.5 (3) MG/3ML IN SOLN
3.0000 mL | RESPIRATORY_TRACT | Status: DC
  Administered 2023-04-21 – 2023-04-24 (×15): 3 mL via RESPIRATORY_TRACT
  Filled 2023-04-21 (×15): qty 3

## 2023-04-21 MED ORDER — SODIUM CHLORIDE 0.9 % IV SOLN
1.0000 g | Freq: Once | INTRAVENOUS | Status: AC
  Administered 2023-04-21: 1 g via INTRAVENOUS
  Filled 2023-04-21: qty 10

## 2023-04-21 MED ORDER — SERTRALINE HCL 25 MG PO TABS
25.0000 mg | ORAL_TABLET | Freq: Every day | ORAL | Status: DC
  Administered 2023-04-22 – 2023-04-24 (×3): 25 mg via ORAL
  Filled 2023-04-21 (×3): qty 1

## 2023-04-21 MED ORDER — ALBUTEROL SULFATE (2.5 MG/3ML) 0.083% IN NEBU: 2.5000 mg | INHALATION_SOLUTION | RESPIRATORY_TRACT | Status: DC | PRN

## 2023-04-21 MED ORDER — ACETAMINOPHEN 325 MG PO TABS: 650.0000 mg | ORAL_TABLET | Freq: Four times a day (QID) | ORAL | Status: DC | PRN

## 2023-04-21 MED ORDER — LACTATED RINGERS IV BOLUS
1000.0000 mL | Freq: Once | INTRAVENOUS | Status: AC
  Administered 2023-04-21: 1000 mL via INTRAVENOUS

## 2023-04-21 MED ORDER — METHYLPREDNISOLONE SODIUM SUCC 125 MG IJ SOLR
125.0000 mg | Freq: Once | INTRAMUSCULAR | Status: AC
  Administered 2023-04-21: 125 mg via INTRAVENOUS
  Filled 2023-04-21: qty 2

## 2023-04-21 MED ORDER — MAGNESIUM SULFATE 50 % IJ SOLN
2.0000 g | Freq: Once | INTRAMUSCULAR | Status: DC
  Filled 2023-04-21: qty 4

## 2023-04-21 MED ORDER — ALBUTEROL SULFATE (2.5 MG/3ML) 0.083% IN NEBU
INHALATION_SOLUTION | RESPIRATORY_TRACT | Status: AC
  Administered 2023-04-21: 10 mg
  Filled 2023-04-21: qty 12

## 2023-04-21 MED ORDER — IPRATROPIUM-ALBUTEROL 0.5-2.5 (3) MG/3ML IN SOLN
3.0000 mL | Freq: Once | RESPIRATORY_TRACT | Status: AC
  Administered 2023-04-21: 3 mL via RESPIRATORY_TRACT
  Filled 2023-04-21: qty 3

## 2023-04-21 MED ORDER — AMLODIPINE BESYLATE 10 MG PO TABS
10.0000 mg | ORAL_TABLET | Freq: Every day | ORAL | Status: DC
  Administered 2023-04-21 – 2023-04-24 (×4): 10 mg via ORAL
  Filled 2023-04-21 (×4): qty 1

## 2023-04-21 MED ORDER — IPRATROPIUM-ALBUTEROL 0.5-2.5 (3) MG/3ML IN SOLN
3.0000 mL | Freq: Four times a day (QID) | RESPIRATORY_TRACT | Status: DC
  Administered 2023-04-21: 3 mL via RESPIRATORY_TRACT
  Filled 2023-04-21: qty 3

## 2023-04-21 MED ORDER — ALBUTEROL SULFATE (2.5 MG/3ML) 0.083% IN NEBU: 10.0000 mg | INHALATION_SOLUTION | Freq: Once | RESPIRATORY_TRACT | Status: DC

## 2023-04-21 MED ORDER — SODIUM CHLORIDE 0.9 % IV SOLN
500.0000 mg | Freq: Once | INTRAVENOUS | Status: AC
  Administered 2023-04-21: 500 mg via INTRAVENOUS
  Filled 2023-04-21: qty 5

## 2023-04-21 MED ORDER — PREDNISONE 5 MG PO TABS: 50.0000 mg | ORAL_TABLET | Freq: Every day | ORAL | Status: DC

## 2023-04-21 MED ORDER — IOHEXOL 350 MG/ML SOLN
100.0000 mL | Freq: Once | INTRAVENOUS | Status: AC | PRN
  Administered 2023-04-21: 75 mL via INTRAVENOUS

## 2023-04-21 MED ORDER — MAGNESIUM SULFATE 2 GM/50ML IV SOLN
INTRAVENOUS | Status: AC
  Filled 2023-04-21: qty 50

## 2023-04-21 MED ORDER — ACETAMINOPHEN 650 MG RE SUPP: 650.0000 mg | Freq: Four times a day (QID) | RECTAL | Status: DC | PRN

## 2023-04-21 MED ORDER — SODIUM CHLORIDE 0.9% FLUSH
3.0000 mL | Freq: Two times a day (BID) | INTRAVENOUS | Status: DC
  Administered 2023-04-21 – 2023-04-24 (×6): 3 mL via INTRAVENOUS

## 2023-04-21 MED ORDER — ENOXAPARIN SODIUM 40 MG/0.4ML IJ SOSY
40.0000 mg | PREFILLED_SYRINGE | INTRAMUSCULAR | Status: DC
  Administered 2023-04-21 – 2023-04-23 (×3): 40 mg via SUBCUTANEOUS
  Filled 2023-04-21 (×3): qty 0.4

## 2023-04-21 MED ORDER — IPRATROPIUM-ALBUTEROL 0.5-2.5 (3) MG/3ML IN SOLN
9.0000 mL | Freq: Once | RESPIRATORY_TRACT | Status: AC
  Administered 2023-04-21: 9 mL via RESPIRATORY_TRACT
  Filled 2023-04-21: qty 9

## 2023-04-21 MED ORDER — ALBUTEROL SULFATE (2.5 MG/3ML) 0.083% IN NEBU
2.5000 mg | INHALATION_SOLUTION | Freq: Once | RESPIRATORY_TRACT | Status: AC
  Administered 2023-04-21: 2.5 mg via RESPIRATORY_TRACT
  Filled 2023-04-21: qty 3

## 2023-04-21 MED ORDER — POLYETHYLENE GLYCOL 3350 17 G PO PACK: 17.0000 g | PACK | Freq: Every day | ORAL | Status: DC | PRN

## 2023-04-21 MED ORDER — MAGNESIUM SULFATE 2 GM/50ML IV SOLN
2.0000 g | Freq: Once | INTRAVENOUS | Status: AC
  Administered 2023-04-21: 2 g via INTRAVENOUS

## 2023-04-21 MED ORDER — SODIUM CHLORIDE 0.9 % IV BOLUS
1000.0000 mL | Freq: Once | INTRAVENOUS | Status: AC
  Administered 2023-04-21: 1000 mL via INTRAVENOUS

## 2023-04-21 NOTE — H&P (Signed)
History and Physical   Michele Collins EXB:[id number] DOB: 1986-03-31 DOA: 04/21/2023  PCP: Patient, No Pcp Per   Patient coming from: Home  Chief Complaint: Shortness of breath  HPI: Michele Collins is a 37 y.o. female with medical history significant of asthma, anemia, PTSD, anxiety, cocaine use, hypertension presenting with shortness of breath and chest pain  Patient reports 4 days of shortness of breath which is gradually been worsening.  States that has not been responding to her home inhalers.  Reports  subjective fever but has not measured it.  Also reporting diaphoresis.  Denies sick contacts.  Has had some chest pain associated with work of breathing.  Denies chills, abdominal pain, constipation, diarrhea, nausea, vomiting.  ED Course: Signs in the ED notable for blood pressure in the 130s to 150s systolic, heart rate in the 90s to 110s, respiratory rate in the 20s to 30s, has remained on room air.  Lab workup included BMP within normal limits.  CBC with hemoglobin stable at 10.  Troponin mildly elevated 18 but downtrending to 16 on repeat.  BNP borderline at 203.  Lactic acid mildly elevated 2.7 with repeat pending.  COVID screen negative.  Chest x-ray showed no acute abnormality.  CTA PE study was negative for PE, did show patchy groundglass opacity in the right middle lobe suspicious for atypical pneumonia but could also include other causes of pneumonitis.  Also noted was lymphadenopathy likely reactive but read states that sarcoid could appear similar.  Patient received ceftriaxone, azithromycin, Solu-Medrol, DuoNebs x 2, albuterol x 2, magnesium, 2 L of IV fluids in the ED.  Review of Systems: As per HPI otherwise all other systems reviewed and are negative.  Past Medical History:  Diagnosis Date   Hypertension    Influenza A 09/29/2022   Lactic acidosis 10/01/2022   Pneumonia of right lower lobe due to infectious organism 09/30/2022    History reviewed. No pertinent  surgical history.  Social History  reports that she has been smoking cigarettes. She has a 2.50 pack-year smoking history. She does not have any smokeless tobacco history on file. She reports that she does not drink alcohol. No history on file for drug use.  No Known Allergies  History reviewed. No pertinent family history.   Prior to Admission medications   Medication Sig Start Date End Date Taking? Authorizing Provider  albuterol (VENTOLIN HFA) 108 (90 Base) MCG/ACT inhaler Inhale 2 puffs into the lungs every 4 (four) hours as needed for wheezing or shortness of breath. 10/03/22   Shalhoub, Deno Lunger, MD  amLODipine (NORVASC) 10 MG tablet Take 1 tablet (10 mg total) by mouth daily. 10/04/22   Shalhoub, Deno Lunger, MD  ferrous sulfate 325 (65 FE) MG tablet Take 1 tablet (325 mg total) by mouth daily before breakfast. If able, take with cup of orange juice. 10/03/22 10/03/23  Shalhoub, Deno Lunger, MD  guaiFENesin (MUCINEX) 600 MG 12 hr tablet Take 1,200 mg by mouth 2 (two) times daily as needed for cough or to loosen phlegm.    [provider]  ibuprofen (ADVIL) 200 MG tablet Take 400 mg by mouth every 6 (six) hours as needed for mild pain.    [provider]  sertraline (ZOLOFT) 25 MG tablet Take 1 tablet (25 mg total) by mouth daily. 10/03/22   Marinda Elk, MD    Physical Exam: Vitals:   04/21/23 1552 04/21/23 1600 04/21/23 1615 04/21/23 1700  BP:  (!) 141/92  (!) 145/86  Pulse:  (!) 115 (!) 114 (!) 116  Resp:  (!) 34 (!) 28 (!) 24  Temp: 100.1 F (37.8 C)   98.1 F (36.7 C)  TempSrc: Oral   Oral  SpO2:  97% 97%   Weight:      Height:        Physical Exam Constitutional:      General: She is not in acute distress.    Appearance: Normal appearance.  HENT:     Head: Normocephalic and atraumatic.     Mouth/Throat:     Mouth: Mucous membranes are moist.     Pharynx: Oropharynx is clear.  Eyes:     Extraocular Movements: Extraocular movements intact.      Pupils: Pupils are equal, round, and reactive to light.  Cardiovascular:     Rate and Rhythm: Regular rhythm. Tachycardia present.     Pulses: Normal pulses.     Heart sounds: Normal heart sounds.  Pulmonary:     Effort: Pulmonary effort is normal. No respiratory distress.     Breath sounds: Wheezing present.  Abdominal:     General: Bowel sounds are normal. There is no distension.     Palpations: Abdomen is soft.     Tenderness: There is no abdominal tenderness.  Musculoskeletal:        General: No swelling or deformity.  Skin:    General: Skin is warm and dry.  Neurological:     General: No focal deficit present.     Mental Status: Mental status is at baseline.    Labs on Admission: I have personally reviewed following labs and imaging studies  CBC: Recent Labs  Lab 04/21/23 1123  WBC 5.6  HGB 10.0*  HCT 33.1*  MCV 80.1  PLT 208    Basic Metabolic Panel: Recent Labs  Lab 04/21/23 1123  NA 135  K 3.8  CL 106  CO2 22  GLUCOSE 97  BUN 9  CREATININE 0.69  CALCIUM 9.3    GFR: Estimated Creatinine Clearance: 107.6 mL/min (by C-G formula based on SCr of 0.69 mg/dL).  Liver Function Tests: No results for input(s): "AST", "ALT", "ALKPHOS", "BILITOT", "PROT", "ALBUMIN" in the last 168 hours.  Urine analysis:    Component Value Date/Time   COLORURINE YELLOW 10/01/2022 0905   APPEARANCEUR CLEAR 10/01/2022 0905   LABSPEC 1.018 10/01/2022 0905   PHURINE 5.0 10/01/2022 0905   GLUCOSEU NEGATIVE 10/01/2022 0905   HGBUR LARGE (A) 10/01/2022 0905   BILIRUBINUR NEGATIVE 10/01/2022 0905   KETONESUR NEGATIVE 10/01/2022 0905   PROTEINUR NEGATIVE 10/01/2022 0905   NITRITE NEGATIVE 10/01/2022 0905   LEUKOCYTESUR NEGATIVE 10/01/2022 0905    Radiological Exams on Admission: CT Angio Chest PE W/Cm &/Or Wo Cm  Result Date: 04/21/2023 CLINICAL DATA:  Tachypnea, tachycardia, worsening shortness of breath and chest pain starting 3 days ago. EXAM: CT ANGIOGRAPHY CHEST WITH  CONTRAST TECHNIQUE: Multidetector CT imaging of the chest was performed using the standard protocol during bolus administration of intravenous contrast. Multiplanar CT image reconstructions and MIPs were obtained to evaluate the vascular anatomy. RADIATION DOSE REDUCTION: This exam was performed according to the departmental dose-optimization program which includes automated exposure control, adjustment of the mA and/or kV according to patient size and/or use of iterative reconstruction technique. CONTRAST:  75mL OMNIPAQUE IOHEXOL 350 MG/ML SOLN COMPARISON:  CT chest angiogram dated 07/20/2022. FINDINGS: Cardiovascular: Evaluation of the majority of the most peripheral segmental and subsegmental pulmonary arteries is limited by beam hardening artifact, however, there is no  pulmonary embolism identified within the main, lobar or central segmental pulmonary arteries bilaterally. No thoracic aortic aneurysm or evidence of aortic dissection. No pericardial effusion. Mediastinum/Nodes: No mass or enlarged lymph nodes are seen within the mediastinum. Moderately enlarged lymph nodes are seen within the bilateral perihilar regions. Esophagus is unremarkable. Trachea and central bronchi are unremarkable. Lungs/Pleura: Small patchy ground-glass opacities within the RIGHT middle lobe. Previously described 3 mm nodule within the periphery of the RIGHT middle lobe is stable. There is stable scarring/atelectasis at the bilateral lung bases. No pleural effusion or pneumothorax. Upper Abdomen: Limited images of the upper abdomen are unremarkable. Musculoskeletal: Osseous structures about the chest are unremarkable. Review of the MIP images confirms the above findings. IMPRESSION: 1. No pulmonary embolism is seen, but with mild study limitations detailed above. 2. Small patchy ground-glass opacities within the RIGHT middle lobe, suspicious for pneumonia. Differential includes atypical pneumonias such as viral or fungal, interstitial  pneumonias, chronic interstitial diseases, hypersensitivity pneumonitis, and respiratory bronchiolitis. 3. Moderately enlarged lymph nodes within the bilateral perihilar regions, most likely reactive. Sarcoidosis could cause a similar appearance. 4. Additional chronic/incidental findings detailed above. Electronically Signed   By: Bary Richard M.D.   On: 04/21/2023 14:16   DG Chest Port 1 View  Result Date: 04/21/2023 CLINICAL DATA:  Shortness of breath and chest pain. EXAM: PORTABLE CHEST 1 VIEW COMPARISON:  Chest x-ray dated 09/29/2022 FINDINGS: Heart size and mediastinal contours are within normal limits. Coarse lung markings bilaterally. No confluent opacity to suggest a developing pneumonia. No pleural effusion or pneumothorax is seen. Osseous structures about the chest are unremarkable. IMPRESSION: 1. No active disease. No evidence of pneumonia or pulmonary edema. 2. Probable chronic bronchitic change and/or chronic interstitial lung disease of both lungs. Electronically Signed   By: Bary Richard M.D.   On: 04/21/2023 12:43    EKG: Independently reviewed.  Sinus tachycardia at 114 bpm.  Baseline wander.  Assessment/Plan Principal Problem:   Acute asthma exacerbation Active Problems:   Cocaine abuse (HCC)   Generalized anxiety disorder   Microcytic anemia   PTSD (post-traumatic stress disorder)   Acute asthma exacerbation > Presenting with worsening shortness of breath not responding to home inhalers in the setting of Asthma. > Wheezing on exam.  Chest x-ray without acute normality, CTA negative for PE but showed some groundglass opacity in the right middle lobe which could represent atypical pneumonia versus other etiology. > No leukocytosis, patient did receive ceftriaxone azithromycin in the ED to cover for bacterial pneumonia, currently more suspicious for atypical/viral pneumonia.  Negative for COVID in ED.  Will check full respiratory viral panel to further evaluate for viral etiology  and check procalcitonin. - Monitor in progressive unit - DuoNebs every 6 hours - As needed albuterol - Daily steroids - Full RVP - Trend fever curve and WBC - Hold off on further antibiotics for now - Supportive care  Anemia > Hemoglobin stable/higher than baseline at 10. - Trend CBC  PTSD Anxiety - Continue home sertraline  Hypertension - Continue home amlodipine  Cocaine use - Noted  DVT prophylaxis: Lovenox Code Status:   Full Family Communication:  None on admission  Disposition Plan:   Patient is from:  Home  Anticipated DC to:  Home  Anticipated DC date:  1 to 3 days  Anticipated DC barriers: None  Consults called:  None Admission status:  Observation, progressive  Severity of Illness: The appropriate patient status for this patient is OBSERVATION. Observation status is judged to  be reasonable and necessary in order to provide the required intensity of service to ensure the patient's safety. The patient's presenting symptoms, physical exam findings, and initial radiographic and laboratory data in the context of their medical condition is felt to place them at decreased risk for further clinical deterioration. Furthermore, it is anticipated that the patient will be medically stable for discharge from the hospital within 2 midnights of admission.    Synetta Fail MD [facility]  How to contact the [facility] Attending or Consulting provider 7A - 7P or covering provider during after hours 7P -7A, for this patient?   Check the care team in [facility] and look for a) attending/consulting [facility] provider listed and b) the [facility] team listed Log into [email or web address] and use [place]'s universal password to access. If you do not have the password, please contact the hospital operator. Locate the [facility] provider you are looking for under [facility] and page to a number that you can be directly reached. If you still have difficulty reaching the provider, please page the [facility]  (Director on Call) for the Hospitalists listed on amion for assistance.  04/21/2023, 5:53 PM

## 2023-04-21 NOTE — ED Notes (Signed)
Pt aware of the need for a urine... Unable to currently provide the sample... 

## 2023-04-21 NOTE — ED Triage Notes (Signed)
Pt states she had pneumonia in January, and hasn't fully recovered, she states she has been using an inhaler since then.

## 2023-04-21 NOTE — ED Provider Notes (Signed)
[facility] AT [facility] Provider Note   CSN: [id number] Arrival date & time: 04/21/23  1116     History  Chief Complaint  Patient presents with   Shortness of Breath   Chest Pain    Michele Collins is a 37 y.o. female history of respiratory failure, polysubstance use including marijuana/cocaine, anxiety disorder with panic attacks, asthma, anemia presented with shortness of breath for the past 4 days.  Patient states that has been getting progressively worse and her inhaler has not been helping.  Patient thinks she has been having fevers but is unsure of her temperature.  Patient is only able to speak in short sentences at this time due to her shortness of breath and so at this time history is limited.  Family states that this morning patient appeared diaphoretic to them.  Family denies any sick contacts.  ROS limited due to patient's shortness of breath  Home Medications Prior to Admission medications   Medication Sig Start Date End Date Taking? Authorizing Provider  albuterol (VENTOLIN HFA) 108 (90 Base) MCG/ACT inhaler Inhale 2 puffs into the lungs every 4 (four) hours as needed for wheezing or shortness of breath. 10/03/22   Shalhoub, Deno Lunger, MD  amLODipine (NORVASC) 10 MG tablet Take 1 tablet (10 mg total) by mouth daily. 10/04/22   Shalhoub, Deno Lunger, MD  ferrous sulfate 325 (65 FE) MG tablet Take 1 tablet (325 mg total) by mouth daily before breakfast. If able, take with cup of orange juice. 10/03/22 10/03/23  Shalhoub, Deno Lunger, MD  guaiFENesin (MUCINEX) 600 MG 12 hr tablet Take 1,200 mg by mouth 2 (two) times daily as needed for cough or to loosen phlegm.    [provider]  ibuprofen (ADVIL) 200 MG tablet Take 400 mg by mouth every 6 (six) hours as needed for mild pain.    [provider]  sertraline (ZOLOFT) 25 MG tablet Take 1 tablet (25 mg total) by mouth daily. 10/03/22   Shalhoub, Deno Lunger, MD      Allergies    Patient  has no known allergies.    Review of Systems   Review of Systems  Respiratory:  Positive for shortness of breath.   Cardiovascular:  Positive for chest pain.    Physical Exam Updated Vital Signs BP (!) 159/92   Pulse 100   Temp 99.4 F (37.4 C) (Oral)   Resp (!) 32   Ht 5\' 8"  (1.727 m)   Wt 79.4 kg   SpO2 100%   BMI 26.61 kg/m  Physical Exam Constitutional:      General: She is in acute distress.  Cardiovascular:     Rate and Rhythm: Regular rhythm. Tachycardia present.     Pulses: Normal pulses.     Heart sounds: Normal heart sounds.  Pulmonary:     Effort: Respiratory distress present.     Comments: Increased work of breathing, pursed lips, accessory muscle use Only able to speak in short sentences Expiratory wheezing noted bilaterally Abdominal:     Palpations: Abdomen is soft.     Tenderness: There is no abdominal tenderness. There is no guarding or rebound.  Musculoskeletal:     Right lower leg: No edema.     Left lower leg: No edema.  Skin:    General: Skin is warm and dry.     Comments: No overlying skin color changes  Neurological:     Mental Status: She is alert.     ED Results /  Procedures / Treatments   Labs (all labs ordered are listed, but only abnormal results are displayed) Labs Reviewed  CBC - Abnormal; Notable for the following components:      Result Value   Hemoglobin 10.0 (*)    HCT 33.1 (*)    MCH 24.2 (*)    RDW 16.1 (*)    All other components within normal limits  BRAIN NATRIURETIC PEPTIDE - Abnormal; Notable for the following components:   B Natriuretic Peptide 203.6 (*)    All other components within normal limits  TROPONIN I (HIGH SENSITIVITY) - Abnormal; Notable for the following components:   Troponin I (High Sensitivity) 18 (*)    All other components within normal limits  SARS CORONAVIRUS 2 BY RT PCR  CULTURE, BLOOD (ROUTINE X 2)  CULTURE, BLOOD (ROUTINE X 2)  BASIC METABOLIC PANEL  PREGNANCY, URINE  LACTIC ACID,  PLASMA  LACTIC ACID, PLASMA  TROPONIN I (HIGH SENSITIVITY)    EKG EKG Interpretation Date/Time:  Thursday April 21 2023 11:23:26 EDT Ventricular Rate:  114 PR Interval:  158 QRS Duration:  68 QT Interval:  318 QTC Calculation: 438 R Axis:   84  Text Interpretation: Sinus tachycardia Otherwise normal ECG When compared with ECG of 01-Oct-2022 09:49, No significant change was found Confirmed by Coralee Pesa [phone]) on 04/21/2023 11:54:05 AM  Radiology CT Angio Chest PE W/Cm &/Or Wo Cm  Result Date: 04/21/2023 CLINICAL DATA:  Tachypnea, tachycardia, worsening shortness of breath and chest pain starting 3 days ago. EXAM: CT ANGIOGRAPHY CHEST WITH CONTRAST TECHNIQUE: Multidetector CT imaging of the chest was performed using the standard protocol during bolus administration of intravenous contrast. Multiplanar CT image reconstructions and MIPs were obtained to evaluate the vascular anatomy. RADIATION DOSE REDUCTION: This exam was performed according to the departmental dose-optimization program which includes automated exposure control, adjustment of the mA and/or kV according to patient size and/or use of iterative reconstruction technique. CONTRAST:  75mL OMNIPAQUE IOHEXOL 350 MG/ML SOLN COMPARISON:  CT chest angiogram dated 07/20/2022. FINDINGS: Cardiovascular: Evaluation of the majority of the most peripheral segmental and subsegmental pulmonary arteries is limited by beam hardening artifact, however, there is no pulmonary embolism identified within the main, lobar or central segmental pulmonary arteries bilaterally. No thoracic aortic aneurysm or evidence of aortic dissection. No pericardial effusion. Mediastinum/Nodes: No mass or enlarged lymph nodes are seen within the mediastinum. Moderately enlarged lymph nodes are seen within the bilateral perihilar regions. Esophagus is unremarkable. Trachea and central bronchi are unremarkable. Lungs/Pleura: Small patchy ground-glass opacities within the RIGHT  middle lobe. Previously described 3 mm nodule within the periphery of the RIGHT middle lobe is stable. There is stable scarring/atelectasis at the bilateral lung bases. No pleural effusion or pneumothorax. Upper Abdomen: Limited images of the upper abdomen are unremarkable. Musculoskeletal: Osseous structures about the chest are unremarkable. Review of the MIP images confirms the above findings. IMPRESSION: 1. No pulmonary embolism is seen, but with mild study limitations detailed above. 2. Small patchy ground-glass opacities within the RIGHT middle lobe, suspicious for pneumonia. Differential includes atypical pneumonias such as viral or fungal, interstitial pneumonias, chronic interstitial diseases, hypersensitivity pneumonitis, and respiratory bronchiolitis. 3. Moderately enlarged lymph nodes within the bilateral perihilar regions, most likely reactive. Sarcoidosis could cause a similar appearance. 4. Additional chronic/incidental findings detailed above. Electronically Signed   By: Bary Richard M.D.   On: 04/21/2023 14:16   DG Chest Port 1 View  Result Date: 04/21/2023 CLINICAL DATA:  Shortness of breath and chest  pain. EXAM: PORTABLE CHEST 1 VIEW COMPARISON:  Chest x-ray dated 09/29/2022 FINDINGS: Heart size and mediastinal contours are within normal limits. Coarse lung markings bilaterally. No confluent opacity to suggest a developing pneumonia. No pleural effusion or pneumothorax is seen. Osseous structures about the chest are unremarkable. IMPRESSION: 1. No active disease. No evidence of pneumonia or pulmonary edema. 2. Probable chronic bronchitic change and/or chronic interstitial lung disease of both lungs. Electronically Signed   By: Bary Richard M.D.   On: 04/21/2023 12:43    Procedures .Critical Care  Performed by: Netta Corrigan, PA-C Authorized by: Netta Corrigan, PA-C   Critical care provider statement:    Critical care time (minutes):  30   Critical care was necessary to treat or  prevent imminent or life-threatening deterioration of the following conditions:  Respiratory failure   Critical care was time spent personally by me on the following activities:  Development of treatment plan with patient or surrogate, blood draw for specimens, ordering and performing treatments and interventions, ordering and review of laboratory studies, ordering and review of radiographic studies, pulse oximetry, re-evaluation of patient's condition, review of old charts, obtaining history from patient or surrogate, examination of patient and evaluation of patient's response to treatment   I assumed direction of critical care for this patient from another provider in my specialty: no     Care discussed with: admitting provider       Medications Ordered in ED Medications  magnesium sulfate 2 GM/50ML IVPB (  Not Given 04/21/23 1220)  albuterol (PROVENTIL) (2.5 MG/3ML) 0.083% nebulizer solution 10 mg (10 mg Nebulization Not Given 04/21/23 1333)  cefTRIAXone (ROCEPHIN) 1 g in sodium chloride 0.9 % 100 mL IVPB (1 g Intravenous New Bag/Given 04/21/23 1458)  azithromycin (ZITHROMAX) 500 mg in sodium chloride 0.9 % 250 mL IVPB (has no administration in time range)  ipratropium-albuterol (DUONEB) 0.5-2.5 (3) MG/3ML nebulizer solution 9 mL (9 mLs Nebulization Given 04/21/23 1140)  methylPREDNISolone sodium succinate (SOLU-MEDROL) 125 mg/2 mL injection 125 mg (125 mg Intravenous Given 04/21/23 1209)  sodium chloride 0.9 % bolus 1,000 mL (0 mLs Intravenous Stopped 04/21/23 1222)  magnesium sulfate IVPB 2 g 50 mL (0 g Intravenous Stopped 04/21/23 1314)  albuterol (PROVENTIL) (2.5 MG/3ML) 0.083% nebulizer solution 2.5 mg (10 mg Nebulization Given 04/21/23 1326)  ipratropium-albuterol (DUONEB) 0.5-2.5 (3) MG/3ML nebulizer solution 3 mL (3 mLs Nebulization Given 04/21/23 1247)  iohexol (OMNIPAQUE) 350 MG/ML injection 100 mL (75 mLs Intravenous Contrast Given 04/21/23 1344)    ED Course/ Medical Decision Making/ A&P                              Medical Decision Making Amount and/or Complexity of Data Reviewed Labs: ordered. Radiology: ordered.  Risk Prescription drug management.   EDDIE ZEC 37 y.o. presented today for shortness of breath.  Working DDx that I considered at this time includes, but not limited to, asthma/COPD exacerbation, URI, viral illness, anemia, ACS, PE, pneumonia, pleural effusion, lung cancer.  R/o DDx: COPD exacerbation, URI, viral illness, anemia, ACS, PE, pleural effusion, lung cancer: These are considered less likely due to history of present illness and physical exam findings  Review of prior external notes: 09/29/2022 discharge summary  Unique Tests and My Interpretation:  CBC: Unremarkable BMP: Unremarkable EKG: Sinus tachycardia 114 bpm, no ST elevations or depressions or blocks noted Troponin: 18, 16 CXR: No acute changes CTA Chest PE: Right middle lobar  pneumonia COVID: Negative  Discussion with Independent Historian:  Family  Discussion of Management of Tests:  A. Rennis Harding, NP  Risk: Medium: prescription drug management  Risk Stratification Score:  None  Staffed with Horton, DO  Plan: On exam patient was in respiratory distress with accessory muscle use and pursing of lips.  Patient is only able to speak in short sentences and so initially history was limited.  It sounds as though patient may have had recent fever indicative of possible pneumonia.  Per patient she had pneumonia back in January which she is fully recovered from and upon chart review patient did have respiratory failure back in December 2023.  Patient be started on breathing treatment, mag, steroids along with fluids.  RT was notified and is currently seeing the patient.  Upon arrival patient is on room air and currently not hypoxic and afebrile.  Patient stable at this time.  Patient received 5 DuoNebs from RT and continued to still be short of breath.  Patient had negative chest x-ray however a  BNP was ordered along with a CTA to fully evaluate and the CTA shows that patient has right middle lobar pneumonia.  This is most likely causing her asthma exacerbation as her wheezing has improved however lungs are still rhonchorous.  Patient is currently requiring continuous albuterol treatment and at this time patient will need to be admitted for her pneumonia and asthma exacerbation.  IV antibiotics were ordered and hospitalist was consulted.  Patient stable for admission at this time.  I spoke to the hospitalist and patient was accepted for admission.  Patient stable for admission at this time.  Patient is currently receiving IV antibiotics after getting blood cultures and a lactic also ordered.         Final Clinical Impression(s) / ED Diagnoses Final diagnoses:  Pneumonia of right middle lobe due to infectious organism  Severe asthma with exacerbation, unspecified whether persistent    Rx / DC Orders ED Discharge Orders     None         Netta Corrigan, PA-C 04/21/23 1519    Horton, Clabe Seal, DO 04/21/23 1545

## 2023-04-21 NOTE — Progress Notes (Addendum)
Patient with asthma exacerbation as well as pneumonia presented to [facility]/Dr [place].  Accepted to a progressive bed.  See email for details.

## 2023-04-21 NOTE — ED Notes (Signed)
Kylie with [facility] called for transport  

## 2023-04-21 NOTE — ED Triage Notes (Signed)
Patient arrives with complaints of worsening shortness of breath and chest pain that started 3 days ago. Also reports headache as well. Rates pain a 7/10.

## 2023-04-21 NOTE — Progress Notes (Signed)
   04/21/23 2000  Vent Select  Invasive or Noninvasive Noninvasive  Adult Vent Y  Adult Ventilator Settings  Vent Type Servo-air  Vent Mode BIPAP (NIV/PC)  Set Rate 15 bmp  FiO2 (%) 40 %  IPAP 13 cmH20  EPAP 5 cmH20  Pressure Control 18 cmH20  PEEP 5 cmH20  Adult Ventilator Measurements  Peak Airway Pressure 18 L/min  Mean Airway Pressure 8 cmH20  Resp Rate Spontaneous 6 br/min  Resp Rate Total 21 br/min  Exhaled Vt 645 mL  Measured Ve 18.2 L  I:E Ratio Measured 1:2.2  Auto PEEP 0 cmH20  Total PEEP 5 cmH20  SpO2 100 %  Adult Ventilator Alarms  Alarms On Y  Ve High Alarm 22 L/min  Ve Low Alarm 4 L/min  Resp Rate High Alarm 36 br/min  Resp Rate Low Alarm 10  PEEP Low Alarm 3 cmH2O  Press High Alarm 25 cmH2O  VAP Prevention  HOB> 30 Degrees Y  Breath Sounds  Bilateral Breath Sounds Expiratory wheezes  R Upper  Breath Sounds Expiratory wheezes  L Upper Breath Sounds Expiratory wheezes  R Lower Breath Sounds Expiratory wheezes  L Lower Breath Sounds Expiratory wheezes  Vent Respiratory Assessment  Level of Consciousness Alert  Respiratory Pattern Labored;Tachypnea

## 2023-04-22 ENCOUNTER — Observation Stay (HOSPITAL_COMMUNITY): Payer: Self-pay

## 2023-04-22 LAB — COMPREHENSIVE METABOLIC PANEL
ALT: 11 U/L (ref 0–44)
AST: 12 U/L — ABNORMAL LOW (ref 15–41)
Albumin: 3.1 g/dL — ABNORMAL LOW (ref 3.5–5.0)
Alkaline Phosphatase: 44 U/L (ref 38–126)
Anion gap: 8 (ref 5–15)
BUN: 8 mg/dL (ref 6–20)
CO2: 18 mmol/L — ABNORMAL LOW (ref 22–32)
Calcium: 8.4 mg/dL — ABNORMAL LOW (ref 8.9–10.3)
Chloride: 108 mmol/L (ref 98–111)
Creatinine, Ser: 0.65 mg/dL (ref 0.44–1.00)
GFR, Estimated: >60 mL/min (ref >60)
Glucose, Bld: 118 mg/dL — ABNORMAL HIGH (ref 70–99)
Potassium: 3.8 mmol/L (ref 3.5–5.1)
Sodium: 134 mmol/L — ABNORMAL LOW (ref 135–145)
Total Bilirubin: 0.3 mg/dL (ref 0.3–1.2)
Total Protein: 6.8 g/dL (ref 6.5–8.1)

## 2023-04-22 LAB — BASIC METABOLIC PANEL
Anion gap: 8 (ref 5–15)
BUN: 9 mg/dL (ref 6–20)
CO2: 21 mmol/L — ABNORMAL LOW (ref 22–32)
Calcium: 8.6 mg/dL — ABNORMAL LOW (ref 8.9–10.3)
Chloride: 106 mmol/L (ref 98–111)
Creatinine, Ser: 0.68 mg/dL (ref 0.44–1.00)
GFR, Estimated: >60 mL/min (ref >60)
Glucose, Bld: 91 mg/dL (ref 70–99)
Potassium: 3.7 mmol/L (ref 3.5–5.1)
Sodium: 135 mmol/L (ref 135–145)

## 2023-04-22 LAB — RESPIRATORY PANEL BY PCR

## 2023-04-22 LAB — CULTURE, BLOOD (ROUTINE X 2)
Culture: NO GROWTH
Culture: NO GROWTH

## 2023-04-22 LAB — BRAIN NATRIURETIC PEPTIDE: B Natriuretic Peptide: 38.8 pg/mL (ref 0.0–100.0)

## 2023-04-22 LAB — CBC
HCT: 28 % — ABNORMAL LOW (ref 36.0–46.0)
Hemoglobin: 8.4 g/dL — ABNORMAL LOW (ref 12.0–15.0)
MCH: 23.9 pg — ABNORMAL LOW (ref 26.0–34.0)
MCHC: 30 g/dL (ref 30.0–36.0)
MCV: 79.5 fL — ABNORMAL LOW (ref 80.0–100.0)
Platelets: 185 10*3/uL (ref 150–400)
RBC: 3.52 MIL/uL — ABNORMAL LOW (ref 3.87–5.11)
RDW: 16.2 % — ABNORMAL HIGH (ref 11.5–15.5)
WBC: 5.5 10*3/uL (ref 4.0–10.5)
nRBC: 0 % (ref 0.0–0.2)

## 2023-04-22 LAB — MAGNESIUM: Magnesium: 1.9 mg/dL (ref 1.7–2.4)

## 2023-04-22 LAB — PROCALCITONIN: Procalcitonin: <0.1 ng/mL

## 2023-04-22 LAB — C-REACTIVE PROTEIN: CRP: 0.6 mg/dL (ref <1.0)

## 2023-04-22 LAB — LACTIC ACID, PLASMA: Lactic Acid, Venous: 1 mmol/L (ref 0.5–1.9)

## 2023-04-22 MED ORDER — POTASSIUM CHLORIDE CRYS ER 10 MEQ PO TBCR
10.0000 meq | EXTENDED_RELEASE_TABLET | Freq: Once | ORAL | Status: AC
  Administered 2023-04-22: 10 meq via ORAL
  Filled 2023-04-22: qty 1

## 2023-04-22 MED ORDER — FUROSEMIDE 40 MG PO TABS
40.0000 mg | ORAL_TABLET | Freq: Once | ORAL | Status: AC
  Administered 2023-04-22: 40 mg via ORAL
  Filled 2023-04-22: qty 1

## 2023-04-22 MED ORDER — AZITHROMYCIN 500 MG PO TABS
500.0000 mg | ORAL_TABLET | Freq: Every day | ORAL | Status: DC
  Administered 2023-04-22 – 2023-04-24 (×3): 500 mg via ORAL
  Filled 2023-04-22 (×3): qty 1

## 2023-04-22 MED ORDER — METHYLPREDNISOLONE SODIUM SUCC 40 MG IJ SOLR
40.0000 mg | Freq: Two times a day (BID) | INTRAMUSCULAR | Status: DC
  Administered 2023-04-22 – 2023-04-24 (×5): 40 mg via INTRAVENOUS
  Filled 2023-04-22 (×5): qty 1

## 2023-04-22 NOTE — TOC CM/SW Note (Signed)
Transition of Care [facility]) - Inpatient Brief Assessment   Patient Details  Name: Michele Collins MRN: [id number] Date of Birth: Nov 20, 1985  [facility]) CM/SW Contact:    Mearl Latin, LCSW Phone Number: 04/22/2023, 5:07 PM   Clinical Narrative: Patient admitted from home with respiratory failure. TOC following for PCP appointment assistance and lack of insurance.    Transition of Care Asessment: Insurance and Status: Selfpay Patient has primary care physician: No Home environment has been reviewed: From home Prior level of function:: Independent Prior/Current Home Services: No current home services Social Determinants of Health Reivew: SDOH reviewed no interventions necessary Readmission risk has been reviewed: Yes Transition of care needs: transition of care needs identified, TOC will continue to follow

## 2023-04-22 NOTE — Progress Notes (Signed)
   04/21/23 1939  Assess: MEWS Score  BP (!) 147/90  MAP (mmHg) 108  Pulse Rate (!) 112  ECG Heart Rate (!) 117  Resp (!) 29  Level of Consciousness Alert  SpO2 100 %  O2 Device Nasal Cannula  O2 Flow Rate (L/min) 3 L/min  Assess: MEWS Score  MEWS Temp 0  MEWS Systolic 0  MEWS Pulse 2  MEWS RR 2  MEWS LOC 0  MEWS Score 4  MEWS Score Color Red  Assess: if the MEWS score is Yellow or Red  Were vital signs taken at a resting state? Yes  Focused Assessment No change from prior assessment  Does the patient meet 2 or more of the SIRS criteria? No  MEWS guidelines implemented  Yes, red  Treat  MEWS Interventions Considered administering scheduled or prn medications/treatments as ordered  Take Vital Signs  Increase Vital Sign Frequency  Red: Q1hr x2, continue Q4hrs until patient remains green for 12hrs  Escalate  MEWS: Escalate Red: Discuss with charge nurse and notify provider. Consider notifying RRT. If remains red for 2 hours consider need for higher level of care  Notify: Charge Nurse/RN  Name of Charge Nurse/RN Notified wendy RN  Provider Notification  Provider Name/Title Dr Julian Reil  Date Provider Notified 04/21/23  Notification Reason Change in status  Provider response See new orders  Date of Provider Response 04/21/23  Notify: Rapid Response  Name of Rapid Response RN Notified Mindy  Assess: SIRS CRITERIA  SIRS Temperature  0  SIRS Pulse 1  SIRS Respirations  1  SIRS WBC 0  SIRS Score Sum  2

## 2023-04-22 NOTE — Progress Notes (Signed)
Patient refused Bipap for the night.   

## 2023-04-22 NOTE — Progress Notes (Signed)
PROGRESS NOTE                                                                                                                                                                                                             Patient Demographics:    Michele Collins, is a 37 y.o. female, DOB - 16-Apr-1986, JYN:[id number]  Outpatient Primary MD for the patient is Patient, No Pcp Per    LOS - 0  Admit date - 04/21/2023    Chief Complaint  Patient presents with   Shortness of Breath   Chest Pain       Brief Narrative (HPI from H&P)   37 y.o. female with medical history significant of asthma, anemia, PTSD, anxiety, cocaine use, hypertension presenting with shortness of breath and chest pain she was diagnosed with acute hypoxic respiratory failure due to acute asthma exacerbation, placed on BiPAP and admitted.   Subjective:    Michele Collins today has, No headache, No chest pain, No abdominal pain - No Nausea, No new weakness tingling or numbness, improved cough and shortness of breath   Assessment  & Plan :    Acute Evoxac respiratory failure due to acute on chronic asthma exacerbation.  In a patient who was still smoking cigarettes, do some cocaine. she has been admitted to the hospital and kept on BiPAP overnight, moving moderate amounts of air now minimal wheezing, transition to nasal cannula oxygen, continue IV steroids, continue azithromycin, strictly counseled to quit smoking and abusing cocaine, advance activity, add flutter valve and I-S for pulmonary toiletry titrate down oxygen.  COVID-negative, respiratory viral panel pending.   Anemia  > able no acute issues PCP to monitor.  PTSD & Anxiety   - Continue home sertraline   Hypertension  - Continue home amlodipine   Cocaine abuse and ongoing smoking.  Counseled to quit both.        Condition - Fair  Family Communication  : Boyfriend bedside 04/22/2023  Code Status :   None  Consults  :  None  PUD Prophylaxis :    Procedures  :     CTA -  1. No pulmonary embolism is seen, but with mild study limitations detailed above. 2. Small patchy ground-glass opacities within the RIGHT middle lobe, suspicious for pneumonia. Differential includes atypical pneumonias such as viral or fungal, interstitial  pneumonias, chronic interstitial diseases, hypersensitivity pneumonitis, and respiratory bronchiolitis. 3. Moderately enlarged lymph nodes within the bilateral perihilar regions, most likely reactive. Sarcoidosis could cause a similar appearance. 4. Additional chronic/incidental findings detailed above      Disposition Plan  :    Status is: Observation  DVT Prophylaxis  :    enoxaparin (LOVENOX) injection 40 mg Start: 04/21/23 2200    Lab Results  Component Value Date   PLT 185 04/22/2023    Diet :  Diet Order             Diet Heart Room service appropriate? Yes; Fluid consistency: Thin  Diet effective now                    Inpatient Medications  Scheduled Meds:  amLODipine  10 mg Oral Daily   azithromycin  500 mg Oral Daily   enoxaparin (LOVENOX) injection  40 mg Subcutaneous Q24H   ipratropium-albuterol  3 mL Nebulization Q4H   methylPREDNISolone (SOLU-MEDROL) injection  40 mg Intravenous Q12H   sertraline  25 mg Oral Daily   sodium chloride flush  3 mL Intravenous Q12H   Continuous Infusions: PRN Meds:.acetaminophen **OR** acetaminophen, albuterol, polyethylene glycol  Antibiotics  :    Anti-infectives (From admission, onward)    Start     Dose/Rate Route Frequency Ordered Stop   04/22/23 1000  azithromycin (ZITHROMAX) tablet 500 mg        500 mg Oral Daily 04/22/23 0910 04/26/23 0959   04/21/23 1500  cefTRIAXone (ROCEPHIN) 1 g in sodium chloride 0.9 % 100 mL IVPB        1 g 200 mL/hr over 30 Minutes Intravenous  Once 04/21/23 1448 04/21/23 1556   04/21/23 1500  azithromycin (ZITHROMAX) 500 mg in sodium chloride 0.9 % 250 mL  IVPB        500 mg 250 mL/hr over 60 Minutes Intravenous  Once 04/21/23 1448 04/21/23 1620         Objective:   Vitals:   04/22/23 0000 04/22/23 0300 04/22/23 0352 04/22/23 0801  BP: (!) 140/81 128/74    Pulse: 96 76 88 (!) 102  Resp: (!) 23 (!) 23 (!) 21   Temp: 98.2 F (36.8 C) 98.3 F (36.8 C)    TempSrc: Axillary Axillary    SpO2: 100% 100% 100%   Weight:      Height:        Wt Readings from Last 3 Encounters:  04/21/23 79.4 kg  10/02/22 72.6 kg  07/20/22 72.6 kg     Intake/Output Summary (Last 24 hours) at 04/22/2023 0913 Last data filed at 04/21/2023 1556 Gross per 24 hour  Intake 1150 ml  Output --  Net 1150 ml     Physical Exam  Awake Alert, No new F.N deficits, Normal affect [place].AT,PERRAL Supple Neck, No JVD,   Symmetrical Chest wall movement, Mod air movement bilaterally, coarse bilateral breath sounds RRR,No Gallops,Rubs or new Murmurs,  +ve B.Sounds, Abd Soft, No tenderness,   No Cyanosis, Clubbing or edema      Data Review:    Recent Labs  Lab 04/21/23 1123 04/22/23 0025  WBC 5.6 5.5  HGB 10.0* 8.4*  HCT 33.1* 28.0*  PLT 208 185  MCV 80.1 79.5*  MCH 24.2* 23.9*  MCHC 30.2 30.0  RDW 16.1* 16.2*    Recent Labs  Lab 04/21/23 1123 04/21/23 1314 04/21/23 1508 04/21/23 1747 04/21/23 1825 04/21/23 2102 04/22/23 0025  NA 135  --   --   --   --   --  134*  K 3.8  --   --   --   --   --  3.8  CL 106  --   --   --   --   --  108  CO2 22  --   --   --   --   --  18*  ANIONGAP 7  --   --   --   --   --  8  GLUCOSE 97  --   --   --   --   --  118*  BUN 9  --   --   --   --   --  8  CREATININE 0.69  --   --   --   --   --  0.65  AST  --   --   --   --   --   --  12*  ALT  --   --   --   --   --   --  11  ALKPHOS  --   --   --   --   --   --  44  BILITOT  --   --   --   --   --   --  0.3  ALBUMIN  --   --   --   --   --   --  3.1*  PROCALCITON  --   --   --   --  <0.10  --   --   LATICACIDVEN  --   --  2.7* 5.1*  --  2.4* 1.0  BNP  --   203.6*  --   --   --   --   --   CALCIUM 9.3  --   --   --   --   --  8.4*      Recent Labs  Lab 04/21/23 1123 04/21/23 1314 04/21/23 1508 04/21/23 1747 04/21/23 1825 04/21/23 2102 04/22/23 0025  PROCALCITON  --   --   --   --  <0.10  --   --   LATICACIDVEN  --   --  2.7* 5.1*  --  2.4* 1.0  BNP  --  203.6*  --   --   --   --   --   CALCIUM 9.3  --   --   --   --   --  8.4*   Radiology Reports DG Chest Port 1 View  Result Date: 04/22/2023 CLINICAL DATA:  Shortness of breath EXAM: PORTABLE CHEST 1 VIEW COMPARISON:  Chest radiograph 04/21/2023 FINDINGS: The cardiomediastinal silhouette is normal There is no focal consolidation or pulmonary edema. The ground-glass opacities in the right middle lobe seen on the CTA chest from 1 day prior are not seen on the current study. There is no pleural effusion or pneumothorax There is no acute osseous abnormality. IMPRESSION: 1. The ground-glass opacities seen on the CTA chest from 1 day prior not seen on the current study. 2. No new or worsening focal airspace disease. Electronically Signed   By: Lesia Hausen M.D.   On: 04/22/2023 08:15   CT Angio Chest PE W/Cm &/Or Wo Cm  Result Date: 04/21/2023 CLINICAL DATA:  Tachypnea, tachycardia, worsening shortness of breath and chest pain starting 3 days ago. EXAM: CT ANGIOGRAPHY CHEST WITH CONTRAST TECHNIQUE: Multidetector CT imaging of the chest was performed using the standard protocol during bolus administration of intravenous contrast. Multiplanar CT image reconstructions and MIPs were obtained  to evaluate the vascular anatomy. RADIATION DOSE REDUCTION: This exam was performed according to the departmental dose-optimization program which includes automated exposure control, adjustment of the mA and/or kV according to patient size and/or use of iterative reconstruction technique. CONTRAST:  75mL OMNIPAQUE IOHEXOL 350 MG/ML SOLN COMPARISON:  CT chest angiogram dated 07/20/2022. FINDINGS: Cardiovascular:  Evaluation of the majority of the most peripheral segmental and subsegmental pulmonary arteries is limited by beam hardening artifact, however, there is no pulmonary embolism identified within the main, lobar or central segmental pulmonary arteries bilaterally. No thoracic aortic aneurysm or evidence of aortic dissection. No pericardial effusion. Mediastinum/Nodes: No mass or enlarged lymph nodes are seen within the mediastinum. Moderately enlarged lymph nodes are seen within the bilateral perihilar regions. Esophagus is unremarkable. Trachea and central bronchi are unremarkable. Lungs/Pleura: Small patchy ground-glass opacities within the RIGHT middle lobe. Previously described 3 mm nodule within the periphery of the RIGHT middle lobe is stable. There is stable scarring/atelectasis at the bilateral lung bases. No pleural effusion or pneumothorax. Upper Abdomen: Limited images of the upper abdomen are unremarkable. Musculoskeletal: Osseous structures about the chest are unremarkable. Review of the MIP images confirms the above findings. IMPRESSION: 1. No pulmonary embolism is seen, but with mild study limitations detailed above. 2. Small patchy ground-glass opacities within the RIGHT middle lobe, suspicious for pneumonia. Differential includes atypical pneumonias such as viral or fungal, interstitial pneumonias, chronic interstitial diseases, hypersensitivity pneumonitis, and respiratory bronchiolitis. 3. Moderately enlarged lymph nodes within the bilateral perihilar regions, most likely reactive. Sarcoidosis could cause a similar appearance. 4. Additional chronic/incidental findings detailed above. Electronically Signed   By: Bary Richard M.D.   On: 04/21/2023 14:16   DG Chest Port 1 View  Result Date: 04/21/2023 CLINICAL DATA:  Shortness of breath and chest pain. EXAM: PORTABLE CHEST 1 VIEW COMPARISON:  Chest x-ray dated 09/29/2022 FINDINGS: Heart size and mediastinal contours are within normal limits. Coarse  lung markings bilaterally. No confluent opacity to suggest a developing pneumonia. No pleural effusion or pneumothorax is seen. Osseous structures about the chest are unremarkable. IMPRESSION: 1. No active disease. No evidence of pneumonia or pulmonary edema. 2. Probable chronic bronchitic change and/or chronic interstitial lung disease of both lungs. Electronically Signed   By: Bary Richard M.D.   On: 04/21/2023 12:43      Signature  -   Susa Raring M.D on 04/22/2023 at 9:13 AM   -  To page go to [email or web address]

## 2023-04-23 LAB — CBC WITH DIFFERENTIAL/PLATELET
Abs Immature Granulocytes: 0.04 10*3/uL (ref 0.00–0.07)
Basophils Absolute: 0 10*3/uL (ref 0.0–0.1)
Basophils Relative: 0 %
Eosinophils Absolute: 0 10*3/uL (ref 0.0–0.5)
Eosinophils Relative: 0 %
HCT: 28.2 % — ABNORMAL LOW (ref 36.0–46.0)
Hemoglobin: 8.6 g/dL — ABNORMAL LOW (ref 12.0–15.0)
Immature Granulocytes: 0 %
Lymphocytes Relative: 8 %
Lymphs Abs: 0.7 10*3/uL (ref 0.7–4.0)
MCH: 24 pg — ABNORMAL LOW (ref 26.0–34.0)
MCHC: 30.5 g/dL (ref 30.0–36.0)
MCV: 78.8 fL — ABNORMAL LOW (ref 80.0–100.0)
Monocytes Absolute: 0.2 10*3/uL (ref 0.1–1.0)
Monocytes Relative: 3 %
Neutro Abs: 8.3 10*3/uL — ABNORMAL HIGH (ref 1.7–7.7)
Neutrophils Relative %: 89 %
Platelets: 213 10*3/uL (ref 150–400)
RBC: 3.58 MIL/uL — ABNORMAL LOW (ref 3.87–5.11)
RDW: 16.2 % — ABNORMAL HIGH (ref 11.5–15.5)
WBC: 9.3 10*3/uL (ref 4.0–10.5)
nRBC: 0 % (ref 0.0–0.2)

## 2023-04-23 LAB — CULTURE, BLOOD (ROUTINE X 2): Special Requests: ADEQUATE

## 2023-04-23 LAB — BASIC METABOLIC PANEL
Anion gap: 8 (ref 5–15)
BUN: 13 mg/dL (ref 6–20)
CO2: 21 mmol/L — ABNORMAL LOW (ref 22–32)
Calcium: 8.7 mg/dL — ABNORMAL LOW (ref 8.9–10.3)
Chloride: 106 mmol/L (ref 98–111)
Creatinine, Ser: 0.79 mg/dL (ref 0.44–1.00)
GFR, Estimated: >60 mL/min (ref >60)
Glucose, Bld: 123 mg/dL — ABNORMAL HIGH (ref 70–99)
Potassium: 3.9 mmol/L (ref 3.5–5.1)
Sodium: 135 mmol/L (ref 135–145)

## 2023-04-23 LAB — MAGNESIUM: Magnesium: 1.9 mg/dL (ref 1.7–2.4)

## 2023-04-23 LAB — C-REACTIVE PROTEIN: CRP: 0.6 mg/dL (ref <1.0)

## 2023-04-23 LAB — PROCALCITONIN: Procalcitonin: <0.1 ng/mL

## 2023-04-23 LAB — BRAIN NATRIURETIC PEPTIDE: B Natriuretic Peptide: 38.1 pg/mL (ref 0.0–100.0)

## 2023-04-23 NOTE — Plan of Care (Signed)

## 2023-04-23 NOTE — Progress Notes (Signed)
SATURATION QUALIFICATIONS: (This note is used to comply with regulatory documentation for home oxygen)  Patient Saturations on Room Air at Rest = 98%  Patient Saturations on Room Air while Ambulating = 96-93%  Patient Saturations on 0 Liters of oxygen while Ambulating = 93%  Please briefly explain why patient needs home oxygen: Patient did not require oxygen on 6 minute walk test.

## 2023-04-23 NOTE — Progress Notes (Signed)
PROGRESS NOTE                                                                                                                                                                                                             Patient Demographics:    Michele Collins, is a 37 y.o. female, DOB - 20-Dec-1985, ZOX:[id number]  Outpatient Primary MD for the patient is Patient, No Pcp Per    LOS - 1  Admit date - 04/21/2023    Chief Complaint  Patient presents with   Shortness of Breath   Chest Pain       Brief Narrative (HPI from H&P)   37 y.o. female with medical history significant of asthma, anemia, PTSD, anxiety, cocaine use, hypertension presenting with shortness of breath and chest pain she was diagnosed with acute hypoxic respiratory failure due to acute asthma exacerbation, placed on BiPAP and admitted.   Subjective:    Michele Collins today has, No headache, No chest pain, No abdominal pain - No Nausea, No new weakness tingling or numbness, improved cough and shortness of breath   Assessment  & Plan :    Acute Evoxac respiratory failure due to acute on chronic asthma exacerbation lung with rhinovirus pneumonia.  In a patient who was still smoking cigarettes does cocaine.. she has been admitted to the hospital and kept on BiPAP overnight, moving moderate amounts of air now minimal wheezing, transition to nasal cannula oxygen, continue IV steroids, continue azithromycin, strictly counseled to quit smoking and abusing cocaine, advance activity, add flutter valve and I-S for pulmonary toiletry titrate down oxygen.  COVID-negative, respiratory viral panel pending.  Advance activity and titrate off oxygen, still requiring 2 to 3 L of oxygen at all times.  Anemia- stable no acute issues PCP to monitor.  PTSD & Anxiety   - Continue home sertraline   Hypertension  - Continue home amlodipine   Cocaine abuse and ongoing smoking.  Counseled  to quit both.        Condition - Fair  Family Communication  : Boyfriend bedside 04/22/2023  Code Status :  None  Consults  :  None  PUD Prophylaxis :    Procedures  :     CTA -  1. No pulmonary embolism is seen, but with mild study limitations detailed above. 2. Small patchy ground-glass opacities  within the RIGHT middle lobe, suspicious for pneumonia. Differential includes atypical pneumonias such as viral or fungal, interstitial pneumonias, chronic interstitial diseases, hypersensitivity pneumonitis, and respiratory bronchiolitis. 3. Moderately enlarged lymph nodes within the bilateral perihilar regions, most likely reactive. Sarcoidosis could cause a similar appearance. 4. Additional chronic/incidental findings detailed above      Disposition Plan  :    Status is: INP  DVT Prophylaxis  :    enoxaparin (LOVENOX) injection 40 mg Start: 04/21/23 2200    Lab Results  Component Value Date   PLT 213 04/23/2023    Diet :  Diet Order             Diet Heart Room service appropriate? Yes; Fluid consistency: Thin  Diet effective now                    Inpatient Medications  Scheduled Meds:  amLODipine  10 mg Oral Daily   azithromycin  500 mg Oral Daily   enoxaparin (LOVENOX) injection  40 mg Subcutaneous Q24H   ipratropium-albuterol  3 mL Nebulization Q4H   methylPREDNISolone (SOLU-MEDROL) injection  40 mg Intravenous Q12H   sertraline  25 mg Oral Daily   sodium chloride flush  3 mL Intravenous Q12H   Continuous Infusions: PRN Meds:.acetaminophen **OR** acetaminophen, albuterol, polyethylene glycol  Antibiotics  :    Anti-infectives (From admission, onward)    Start     Dose/Rate Route Frequency Ordered Stop   04/22/23 1000  azithromycin (ZITHROMAX) tablet 500 mg        500 mg Oral Daily 04/22/23 0910 04/26/23 0959   04/21/23 1500  cefTRIAXone (ROCEPHIN) 1 g in sodium chloride 0.9 % 100 mL IVPB        1 g 200 mL/hr over 30 Minutes Intravenous  Once  04/21/23 1448 04/21/23 1556   04/21/23 1500  azithromycin (ZITHROMAX) 500 mg in sodium chloride 0.9 % 250 mL IVPB        500 mg 250 mL/hr over 60 Minutes Intravenous  Once 04/21/23 1448 04/21/23 1620         Objective:   Vitals:   04/23/23 0200 04/23/23 0400 04/23/23 0739 04/23/23 0835  BP: 129/85 107/74  114/68  Pulse: (!) 106 (!) 102  (!) 110  Resp:    18  Temp:    97.8 F (36.6 C)  TempSrc:    Oral  SpO2: 91% 93% 98% 97%  Weight:      Height:        Wt Readings from Last 3 Encounters:  04/21/23 79.4 kg  10/02/22 72.6 kg  07/20/22 72.6 kg     Intake/Output Summary (Last 24 hours) at 04/23/2023 0910 Last data filed at 04/22/2023 1008 Gross per 24 hour  Intake 3 ml  Output --  Net 3 ml     Physical Exam  Awake Alert, No new F.N deficits, Normal affect [place].AT,PERRAL Supple Neck, No JVD,   Symmetrical Chest wall movement, Mod air movement bilaterally, coarse bilateral breath sounds RRR,No Gallops,Rubs or new Murmurs,  +ve B.Sounds, Abd Soft, No tenderness,   No Cyanosis, Clubbing or edema      Data Review:    Recent Labs  Lab 04/21/23 1123 04/22/23 0025 04/23/23 0314  WBC 5.6 5.5 9.3  HGB 10.0* 8.4* 8.6*  HCT 33.1* 28.0* 28.2*  PLT 208 185 213  MCV 80.1 79.5* 78.8*  MCH 24.2* 23.9* 24.0*  MCHC 30.2 30.0 30.5  RDW 16.1* 16.2* 16.2*  LYMPHSABS  --   --  0.7  MONOABS  --   --  0.2  EOSABS  --   --  0.0  BASOSABS  --   --  0.0    Recent Labs  Lab 04/21/23 1123 04/21/23 1314 04/21/23 1508 04/21/23 1747 04/21/23 1825 04/21/23 2102 04/22/23 0025 04/22/23 1021 04/23/23 0314  NA 135  --   --   --   --   --  134* 135 135  K 3.8  --   --   --   --   --  3.8 3.7 3.9  CL 106  --   --   --   --   --  108 106 106  CO2 22  --   --   --   --   --  18* 21* 21*  ANIONGAP 7  --   --   --   --   --  8 8 8   GLUCOSE 97  --   --   --   --   --  118* 91 123*  BUN 9  --   --   --   --   --  8 9 13   CREATININE 0.69  --   --   --   --   --  0.65 0.68 0.79  AST   --   --   --   --   --   --  12*  --   --   ALT  --   --   --   --   --   --  11  --   --   ALKPHOS  --   --   --   --   --   --  44  --   --   BILITOT  --   --   --   --   --   --  0.3  --   --   ALBUMIN  --   --   --   --   --   --  3.1*  --   --   CRP  --   --   --   --   --   --   --  0.6 0.6  PROCALCITON  --   --   --   --  <0.10  --   --  <0.10 <0.10  LATICACIDVEN  --   --  2.7* 5.1*  --  2.4* 1.0  --   --   BNP  --  203.6*  --   --   --   --   --  38.8 38.1  MG  --   --   --   --   --   --   --  1.9 1.9  CALCIUM 9.3  --   --   --   --   --  8.4* 8.6* 8.7*      Recent Labs  Lab 04/21/23 1123 04/21/23 1314 04/21/23 1508 04/21/23 1747 04/21/23 1825 04/21/23 2102 04/22/23 0025 04/22/23 1021 04/23/23 0314  CRP  --   --   --   --   --   --   --  0.6 0.6  PROCALCITON  --   --   --   --  <0.10  --   --  <0.10 <0.10  LATICACIDVEN  --   --  2.7* 5.1*  --  2.4* 1.0  --   --   BNP  --  203.6*  --   --   --   --   --  38.8 38.1  MG  --   --   --   --   --   --   --  1.9 1.9  CALCIUM 9.3  --   --   --   --   --  8.4* 8.6* 8.7*   Radiology Reports DG Chest Port 1 View  Result Date: 04/22/2023 CLINICAL DATA:  Shortness of breath EXAM: PORTABLE CHEST 1 VIEW COMPARISON:  Chest radiograph 04/21/2023 FINDINGS: The cardiomediastinal silhouette is normal There is no focal consolidation or pulmonary edema. The ground-glass opacities in the right middle lobe seen on the CTA chest from 1 day prior are not seen on the current study. There is no pleural effusion or pneumothorax There is no acute osseous abnormality. IMPRESSION: 1. The ground-glass opacities seen on the CTA chest from 1 day prior not seen on the current study. 2. No new or worsening focal airspace disease. Electronically Signed   By: Lesia Hausen M.D.   On: 04/22/2023 08:15   CT Angio Chest PE W/Cm &/Or Wo Cm  Result Date: 04/21/2023 CLINICAL DATA:  Tachypnea, tachycardia, worsening shortness of breath and chest pain starting 3 days  ago. EXAM: CT ANGIOGRAPHY CHEST WITH CONTRAST TECHNIQUE: Multidetector CT imaging of the chest was performed using the standard protocol during bolus administration of intravenous contrast. Multiplanar CT image reconstructions and MIPs were obtained to evaluate the vascular anatomy. RADIATION DOSE REDUCTION: This exam was performed according to the departmental dose-optimization program which includes automated exposure control, adjustment of the mA and/or kV according to patient size and/or use of iterative reconstruction technique. CONTRAST:  75mL OMNIPAQUE IOHEXOL 350 MG/ML SOLN COMPARISON:  CT chest angiogram dated 07/20/2022. FINDINGS: Cardiovascular: Evaluation of the majority of the most peripheral segmental and subsegmental pulmonary arteries is limited by beam hardening artifact, however, there is no pulmonary embolism identified within the main, lobar or central segmental pulmonary arteries bilaterally. No thoracic aortic aneurysm or evidence of aortic dissection. No pericardial effusion. Mediastinum/Nodes: No mass or enlarged lymph nodes are seen within the mediastinum. Moderately enlarged lymph nodes are seen within the bilateral perihilar regions. Esophagus is unremarkable. Trachea and central bronchi are unremarkable. Lungs/Pleura: Small patchy ground-glass opacities within the RIGHT middle lobe. Previously described 3 mm nodule within the periphery of the RIGHT middle lobe is stable. There is stable scarring/atelectasis at the bilateral lung bases. No pleural effusion or pneumothorax. Upper Abdomen: Limited images of the upper abdomen are unremarkable. Musculoskeletal: Osseous structures about the chest are unremarkable. Review of the MIP images confirms the above findings. IMPRESSION: 1. No pulmonary embolism is seen, but with mild study limitations detailed above. 2. Small patchy ground-glass opacities within the RIGHT middle lobe, suspicious for pneumonia. Differential includes atypical pneumonias  such as viral or fungal, interstitial pneumonias, chronic interstitial diseases, hypersensitivity pneumonitis, and respiratory bronchiolitis. 3. Moderately enlarged lymph nodes within the bilateral perihilar regions, most likely reactive. Sarcoidosis could cause a similar appearance. 4. Additional chronic/incidental findings detailed above. Electronically Signed   By: Bary Richard M.D.   On: 04/21/2023 14:16   DG Chest Port 1 View  Result Date: 04/21/2023 CLINICAL DATA:  Shortness of breath and chest pain. EXAM: PORTABLE CHEST 1 VIEW COMPARISON:  Chest x-ray dated 09/29/2022 FINDINGS: Heart size and mediastinal contours are within normal limits. Coarse lung markings bilaterally. No confluent opacity to suggest a developing pneumonia. No pleural effusion or pneumothorax is seen. Osseous structures about the chest are unremarkable. IMPRESSION: 1. No active disease. No evidence of pneumonia or pulmonary  edema. 2. Probable chronic bronchitic change and/or chronic interstitial lung disease of both lungs. Electronically Signed   By: Bary Richard M.D.   On: 04/21/2023 12:43      Signature  -   Susa Raring M.D on 04/23/2023 at 9:10 AM   -  To page go to [email or web address]

## 2023-04-24 LAB — MAGNESIUM: Magnesium: 1.8 mg/dL (ref 1.7–2.4)

## 2023-04-24 LAB — BASIC METABOLIC PANEL WITH GFR
Anion gap: 8 (ref 5–15)
BUN: 12 mg/dL (ref 6–20)
CO2: 20 mmol/L — ABNORMAL LOW (ref 22–32)
Calcium: 8.6 mg/dL — ABNORMAL LOW (ref 8.9–10.3)
Chloride: 107 mmol/L (ref 98–111)
Creatinine, Ser: 0.64 mg/dL (ref 0.44–1.00)
GFR, Estimated: >60 mL/min
Glucose, Bld: 123 mg/dL — ABNORMAL HIGH (ref 70–99)
Potassium: 4.3 mmol/L (ref 3.5–5.1)
Sodium: 135 mmol/L (ref 135–145)

## 2023-04-24 LAB — CBC WITH DIFFERENTIAL/PLATELET
Abs Immature Granulocytes: 0.03 K/uL (ref 0.00–0.07)
Basophils Absolute: 0 K/uL (ref 0.0–0.1)
Basophils Relative: 0 %
Eosinophils Absolute: 0 K/uL (ref 0.0–0.5)
Eosinophils Relative: 0 %
HCT: 28.9 % — ABNORMAL LOW (ref 36.0–46.0)
Hemoglobin: 8.9 g/dL — ABNORMAL LOW (ref 12.0–15.0)
Immature Granulocytes: 0 %
Lymphocytes Relative: 11 %
Lymphs Abs: 1 K/uL (ref 0.7–4.0)
MCH: 24.6 pg — ABNORMAL LOW (ref 26.0–34.0)
MCHC: 30.8 g/dL (ref 30.0–36.0)
MCV: 79.8 fL — ABNORMAL LOW (ref 80.0–100.0)
Monocytes Absolute: 0.3 K/uL (ref 0.1–1.0)
Monocytes Relative: 3 %
Neutro Abs: 7.7 K/uL (ref 1.7–7.7)
Neutrophils Relative %: 86 %
Platelets: 213 K/uL (ref 150–400)
RBC: 3.62 MIL/uL — ABNORMAL LOW (ref 3.87–5.11)
RDW: 16.3 % — ABNORMAL HIGH (ref 11.5–15.5)
WBC: 9.1 K/uL (ref 4.0–10.5)
nRBC: 0 % (ref 0.0–0.2)

## 2023-04-24 LAB — CULTURE, BLOOD (ROUTINE X 2)

## 2023-04-24 LAB — PROCALCITONIN: Procalcitonin: <0.1 ng/mL

## 2023-04-24 LAB — BRAIN NATRIURETIC PEPTIDE: B Natriuretic Peptide: 26.4 pg/mL (ref 0.0–100.0)

## 2023-04-24 LAB — C-REACTIVE PROTEIN: CRP: 0.5 mg/dL (ref <1.0)

## 2023-04-24 MED ORDER — ACETAMINOPHEN 325 MG PO TABS: 650.0000 mg | ORAL_TABLET | Freq: Four times a day (QID) | ORAL | 0 refills | Status: AC | PRN

## 2023-04-24 MED ORDER — AZITHROMYCIN 500 MG PO TABS: 500.0000 mg | ORAL_TABLET | Freq: Every day | ORAL | 0 refills | Status: AC

## 2023-04-24 MED ORDER — METHYLPREDNISOLONE 4 MG PO TBPK: ORAL_TABLET | ORAL | 0 refills | Status: AC

## 2023-04-24 MED ORDER — ALBUTEROL SULFATE HFA 108 (90 BASE) MCG/ACT IN AERS: 2.0000 | INHALATION_SPRAY | Freq: Four times a day (QID) | RESPIRATORY_TRACT | 0 refills | Status: AC | PRN

## 2023-04-24 NOTE — Discharge Instructions (Signed)
Follow with Primary MD in 7 days   Get CBC, CMP, Magnesium, 2 view Chest X ray -  checked next visit with your primary MD   Activity: As tolerated with Full fall precautions use walker/cane & assistance as needed  Disposition Home    Diet: Heart Healthy    Special Instructions: If you have smoked or chewed Tobacco  in the last 2 yrs please stop smoking, stop any regular Alcohol  and or any Recreational drug use.  On your next visit with your primary care physician please Get Medicines reviewed and adjusted.  Please request your Prim.MD to go over all Hospital Tests and Procedure/Radiological results at the follow up, please get all Hospital records sent to your Prim MD by signing hospital release before you go home.  If you experience worsening of your admission symptoms, develop shortness of breath, life threatening emergency, suicidal or homicidal thoughts you must seek medical attention immediately by calling 911 or calling your MD immediately  if symptoms less severe.  You Must read complete instructions/literature along with all the possible adverse reactions/side effects for all the Medicines you take and that have been prescribed to you. Take any new Medicines after you have completely understood and accpet all the possible adverse reactions/side effects.   Do not drive when taking Pain medications.  Do not take more than prescribed Pain, Sleep and Anxiety Medications

## 2023-04-24 NOTE — Discharge Summary (Signed)
Michele Collins ZOX:[id number] DOB: 1986-03-08 DOA: 04/21/2023  PCP: Patient, No Pcp Per  Admit date: 04/21/2023  Discharge date: 04/24/2023  Admitted From: Home   Disposition:  Home   Recommendations for Outpatient Follow-up:   Follow up with PCP in 1-2 weeks  PCP Please obtain BMP/CBC, 2 view CXR in 1week,  (see Discharge instructions)   PCP Please follow up on the following pending results:    Home Health: None   Equipment/Devices: None  Consultations: None  Discharge Condition: Stable    CODE STATUS: Full    Diet Recommendation: Heart Healthy     Chief Complaint  Patient presents with   Shortness of Breath   Chest Pain     Brief history of present illness from the day of admission and additional interim summary    37 y.o. female with medical history significant of asthma, anemia, PTSD, anxiety, cocaine use, hypertension presenting with shortness of breath and chest pain she was diagnosed with acute hypoxic respiratory failure due to acute asthma exacerbation, placed on BiPAP and admitted.                                                                  Hospital Course   Acute hypoxic respiratory failure due to acute on chronic asthma exacerbation lung with rhinovirus pneumonia.  In a patient who was still smoking cigarettes does cocaine. she was admitted to the hospital and kept on BiPAP overnight, he was also placed on empiric azithromycin, IV steroids, encouraged to sit in chair use I-S and flutter valve for pulmonary toiletry, now she has shown good improvement, wheezing almost completely resolved and she is stable on room air.  Ambulated in the hallway without any difficulty will be discharged home with albuterol inhaler, Medrol Dosepak, 2 more days of oral azithromycin with outpatient PCP follow-up.   Strictly counseled to stop smoking cigarettes and abusing cocaine.  Anemia- stable no acute issues PCP to monitor.   PTSD & Anxiety   - no acute issues she has stopped taking her home sertraline   Hypertension  - stable off of medications PCP to monitor.   Cocaine abuse and ongoing smoking.  Counseled to quit both.    Discharge diagnosis     Principal Problem:   Acute asthma exacerbation Active Problems:   Cocaine abuse (HCC)   Generalized anxiety disorder   Microcytic anemia   PTSD (post-traumatic stress disorder)    Discharge instructions    Discharge Instructions     Diet - low sodium heart healthy   Complete by: As directed    Discharge instructions   Complete by: As directed    Follow with Primary MD in 7 days   Get CBC, CMP, Magnesium, 2 view Chest X ray -  checked next visit with your  primary MD   Activity: As tolerated with Full fall precautions use walker/cane & assistance as needed  Disposition Home    Diet: Heart Healthy    Special Instructions: If you have smoked or chewed Tobacco  in the last 2 yrs please stop smoking, stop any regular Alcohol  and or any Recreational drug use.  On your next visit with your primary care physician please Get Medicines reviewed and adjusted.  Please request your Prim.MD to go over all Hospital Tests and Procedure/Radiological results at the follow up, please get all Hospital records sent to your Prim MD by signing hospital release before you go home.  If you experience worsening of your admission symptoms, develop shortness of breath, life threatening emergency, suicidal or homicidal thoughts you must seek medical attention immediately by calling 911 or calling your MD immediately  if symptoms less severe.  You Must read complete instructions/literature along with all the possible adverse reactions/side effects for all the Medicines you take and that have been prescribed to you. Take any new Medicines after you have  completely understood and accpet all the possible adverse reactions/side effects.   Do not drive when taking Pain medications.  Do not take more than prescribed Pain, Sleep and Anxiety Medications   Increase activity slowly   Complete by: As directed        Discharge Medications   Allergies as of 04/24/2023   No Known Allergies      Medication List     TAKE these medications    acetaminophen 325 MG tablet Commonly known as: TYLENOL Take 2 tablets (650 mg total) by mouth every 6 (six) hours as needed for mild pain.   albuterol 108 (90 Base) MCG/ACT inhaler Commonly known as: VENTOLIN HFA Inhale 2 puffs into the lungs every 6 (six) hours as needed for wheezing or shortness of breath.   azithromycin 500 MG tablet Commonly known as: ZITHROMAX Take 1 tablet (500 mg total) by mouth daily.   methylPREDNISolone 4 MG Tbpk tablet Commonly known as: MEDROL DOSEPAK follow package directions         Follow-up Information     [facility]. Schedule an appointment as soon as possible for a visit in 1 week(s).   Contact information: [address] [place] [address] [phone]                Major procedures and Radiology Reports - PLEASE review detailed and final reports thoroughly  -      DG Chest Port 1 View  Result Date: 04/22/2023 CLINICAL DATA:  Shortness of breath EXAM: PORTABLE CHEST 1 VIEW COMPARISON:  Chest radiograph 04/21/2023 FINDINGS: The cardiomediastinal silhouette is normal There is no focal consolidation or pulmonary edema. The ground-glass opacities in the right middle lobe seen on the CTA chest from 1 day prior are not seen on the current study. There is no pleural effusion or pneumothorax There is no acute osseous abnormality. IMPRESSION: 1. The ground-glass opacities seen on the CTA chest from 1 day prior not seen on the current study. 2. No new or worsening focal airspace disease.  Electronically Signed   By: Lesia Hausen M.D.   On: 04/22/2023 08:15   CT Angio Chest PE W/Cm &/Or Wo Cm  Result Date: 04/21/2023 CLINICAL DATA:  Tachypnea, tachycardia, worsening shortness of breath and chest pain starting 3 days ago. EXAM: CT ANGIOGRAPHY CHEST WITH CONTRAST TECHNIQUE: Multidetector CT imaging of the chest was performed  using the standard protocol during bolus administration of intravenous contrast. Multiplanar CT image reconstructions and MIPs were obtained to evaluate the vascular anatomy. RADIATION DOSE REDUCTION: This exam was performed according to the departmental dose-optimization program which includes automated exposure control, adjustment of the mA and/or kV according to patient size and/or use of iterative reconstruction technique. CONTRAST:  75mL OMNIPAQUE IOHEXOL 350 MG/ML SOLN COMPARISON:  CT chest angiogram dated 07/20/2022. FINDINGS: Cardiovascular: Evaluation of the majority of the most peripheral segmental and subsegmental pulmonary arteries is limited by beam hardening artifact, however, there is no pulmonary embolism identified within the main, lobar or central segmental pulmonary arteries bilaterally. No thoracic aortic aneurysm or evidence of aortic dissection. No pericardial effusion. Mediastinum/Nodes: No mass or enlarged lymph nodes are seen within the mediastinum. Moderately enlarged lymph nodes are seen within the bilateral perihilar regions. Esophagus is unremarkable. Trachea and central bronchi are unremarkable. Lungs/Pleura: Small patchy ground-glass opacities within the RIGHT middle lobe. Previously described 3 mm nodule within the periphery of the RIGHT middle lobe is stable. There is stable scarring/atelectasis at the bilateral lung bases. No pleural effusion or pneumothorax. Upper Abdomen: Limited images of the upper abdomen are unremarkable. Musculoskeletal: Osseous structures about the chest are unremarkable. Review of the MIP images confirms the above  findings. IMPRESSION: 1. No pulmonary embolism is seen, but with mild study limitations detailed above. 2. Small patchy ground-glass opacities within the RIGHT middle lobe, suspicious for pneumonia. Differential includes atypical pneumonias such as viral or fungal, interstitial pneumonias, chronic interstitial diseases, hypersensitivity pneumonitis, and respiratory bronchiolitis. 3. Moderately enlarged lymph nodes within the bilateral perihilar regions, most likely reactive. Sarcoidosis could cause a similar appearance. 4. Additional chronic/incidental findings detailed above. Electronically Signed   By: Bary Richard M.D.   On: 04/21/2023 14:16   DG Chest Port 1 View  Result Date: 04/21/2023 CLINICAL DATA:  Shortness of breath and chest pain. EXAM: PORTABLE CHEST 1 VIEW COMPARISON:  Chest x-ray dated 09/29/2022 FINDINGS: Heart size and mediastinal contours are within normal limits. Coarse lung markings bilaterally. No confluent opacity to suggest a developing pneumonia. No pleural effusion or pneumothorax is seen. Osseous structures about the chest are unremarkable. IMPRESSION: 1. No active disease. No evidence of pneumonia or pulmonary edema. 2. Probable chronic bronchitic change and/or chronic interstitial lung disease of both lungs. Electronically Signed   By: Bary Richard M.D.   On: 04/21/2023 12:43    Micro Results    Recent Results (from the past 240 hour(s))  SARS Coronavirus 2 by RT PCR (hospital order, performed in [facility] hospital lab) *cepheid single result test* Anterior Nasal Swab     Status: None   Collection Time: 04/21/23 11:39 AM   Specimen: Anterior Nasal Swab  Result Value Ref Range Status   SARS Coronavirus 2 by RT PCR NEGATIVE NEGATIVE Final    Comment: (NOTE) SARS-CoV-2 target nucleic acids are NOT DETECTED.  The SARS-CoV-2 RNA is generally detectable in upper and lower respiratory specimens during the acute phase of infection. The lowest concentration of SARS-CoV-2  viral copies this assay can detect is 250 copies / mL. A negative result does not preclude SARS-CoV-2 infection and should not be used as the sole basis for treatment or other patient management decisions.  A negative result may occur with improper specimen collection / handling, submission of specimen other than nasopharyngeal swab, presence of viral mutation(s) within the areas targeted by this assay, and inadequate number of viral copies (<250 copies / mL). A negative result  must be combined with clinical observations, patient history, and epidemiological information.  Fact Sheet for Patients:   RoadLapTop.co.za  Fact Sheet for Healthcare Providers: http://kim-miller.com/  This test is not yet approved or  cleared by the [place] FDA and has been authorized for detection and/or diagnosis of SARS-CoV-2 by FDA under an Emergency Use Authorization (EUA).  This EUA will remain in effect (meaning this test can be used) for the duration of the COVID-19 declaration under Section 564(b)(1) of the Act, 21 U.S.C. section 360bbb-3(b)(1), unless the authorization is terminated or revoked sooner.  Performed at [facility], [address] [place], [place] [address]   Culture, blood (routine x 2)     Status: None (Preliminary result)   Collection Time: 04/21/23  3:20 PM   Specimen: BLOOD RIGHT HAND  Result Value Ref Range Status   Specimen Description   Final    BLOOD RIGHT HAND Performed at [facility], [address], [place], [place] [address]    Special Requests   Final    BOTTLES DRAWN AEROBIC AND ANAEROBIC Blood Culture adequate volume Performed at [facility], [address] [place], [place] [address]    Culture   Final    NO GROWTH 3 DAYS Performed at [facility], [address], [place], [place] [address]    Report Status PENDING  Incomplete  Culture, blood (routine x 2)      Status: None (Preliminary result)   Collection Time: 04/21/23  3:35 PM   Specimen: BLOOD LEFT HAND  Result Value Ref Range Status   Specimen Description   Final    BLOOD LEFT HAND Performed at [facility], [address], [place], [place] [address]    Special Requests   Final    BOTTLES DRAWN AEROBIC AND ANAEROBIC Blood Culture adequate volume Performed at [facility], [address] [place], [place] [address]    Culture   Final    NO GROWTH 3 DAYS Performed at [facility], [address] [place], [place] [address]    Report Status PENDING  Incomplete  Respiratory (~20 pathogens) panel by PCR     Status: Abnormal   Collection Time: 04/21/23  5:52 PM   Specimen: Nasopharyngeal Swab; Respiratory  Result Value Ref Range Status   Adenovirus NOT DETECTED NOT DETECTED Final   Coronavirus 229E NOT DETECTED NOT DETECTED Final    Comment: (NOTE) The Coronavirus on the Respiratory Panel, DOES NOT test for the novel  Coronavirus (2019 nCoV)    Coronavirus HKU1 NOT DETECTED NOT DETECTED Final   Coronavirus NL63 NOT DETECTED NOT DETECTED Final   Coronavirus OC43 NOT DETECTED NOT DETECTED Final   Metapneumovirus NOT DETECTED NOT DETECTED Final   Rhinovirus / Enterovirus DETECTED (A) NOT DETECTED Final   Influenza A NOT DETECTED NOT DETECTED Final   Influenza B NOT DETECTED NOT DETECTED Final   Parainfluenza Virus 1 NOT DETECTED NOT DETECTED Final   Parainfluenza Virus 2 NOT DETECTED NOT DETECTED Final   Parainfluenza Virus 3 NOT DETECTED NOT DETECTED Final   Parainfluenza Virus 4 NOT DETECTED NOT DETECTED Final   Respiratory Syncytial Virus NOT DETECTED NOT DETECTED Final   Bordetella pertussis NOT DETECTED NOT DETECTED Final   Bordetella Parapertussis NOT DETECTED NOT DETECTED Final   Chlamydophila pneumoniae NOT DETECTED NOT DETECTED Final   Mycoplasma pneumoniae NOT DETECTED NOT DETECTED Final    Comment: Performed at [facility],  [address], [place], [place] [address]    Today   Subjective    Michele Collins today has no headache,no chest abdominal pain,no new weakness tingling or numbness, feels much better wants to go home today.    Objective   Blood pressure 109/76, pulse 84, temperature 98.4 F (36.9 C), temperature source Oral, resp. rate (!) 21, height 5\' 8"  (1.727 m), weight 79.4 kg, SpO2 94 %.   Intake/Output Summary (Last 24 hours) at 04/24/2023 0810 Last data filed at 04/24/2023 0500 Gross per 24 hour  Intake 480 ml  Output --  Net 480 ml    Exam  Awake Alert, No new F.N deficits,    [place].AT,PERRAL Supple Neck,   Symmetrical Chest wall movement, Good air movement bilaterally, CTAB RRR,No Gallops,   +ve B.Sounds, Abd Soft, Non tender,  No Cyanosis, Clubbing or edema    Data Review   Recent Labs  Lab 04/21/23 1123 04/22/23 0025 04/23/23 0314 04/24/23 0507  WBC 5.6 5.5 9.3 9.1  HGB 10.0* 8.4* 8.6* 8.9*  HCT 33.1* 28.0* 28.2* 28.9*  PLT 208 185 213 213  MCV 80.1 79.5* 78.8* 79.8*  MCH 24.2* 23.9* 24.0* 24.6*  MCHC 30.2 30.0 30.5 30.8  RDW 16.1* 16.2* 16.2* 16.3*  LYMPHSABS  --   --  0.7 1.0  MONOABS  --   --  0.2 0.3  EOSABS  --   --  0.0 0.0  BASOSABS  --   --  0.0 0.0    Recent Labs  Lab 04/21/23 1123 04/21/23 1314 04/21/23 1508 04/21/23 1747 04/21/23 1825 04/21/23 2102 04/22/23 0025 04/22/23 1021 04/23/23 0314 04/24/23 0507  NA 135  --   --   --   --   --  134* 135 135 135  K 3.8  --   --   --   --   --  3.8 3.7 3.9 4.3  CL 106  --   --   --   --   --  108 106 106 107  CO2 22  --   --   --   --   --  18* 21* 21* 20*  ANIONGAP 7  --   --   --   --   --  8 8 8 8   GLUCOSE 97  --   --   --   --   --  118* 91 123* 123*  BUN 9  --   --   --   --   --  8 9 13 12   CREATININE 0.69  --   --   --   --   --  0.65 0.68 0.79 0.64  AST  --   --   --   --   --   --  12*  --   --   --   ALT  --   --   --   --   --   --  11  --   --   --   ALKPHOS  --   --   --   --   --   --  44   --   --   --   BILITOT  --   --   --   --   --   --  0.3  --   --   --   ALBUMIN  --   --   --   --   --   --  3.1*  --   --   --  CRP  --   --   --   --   --   --   --  0.6 0.6 0.5  PROCALCITON  --   --   --   --  <0.10  --   --  <0.10 <0.10 <0.10  LATICACIDVEN  --   --  2.7* 5.1*  --  2.4* 1.0  --   --   --   BNP  --  203.6*  --   --   --   --   --  38.8 38.1 26.4  MG  --   --   --   --   --   --   --  1.9 1.9 1.8  CALCIUM 9.3  --   --   --   --   --  8.4* 8.6* 8.7* 8.6*    Total Time in preparing paper work, data evaluation and todays exam - 35 minutes  Signature  -    Susa Raring M.D on 04/24/2023 at 8:10 AM   -  To page go to [email or web address]

## 2023-04-26 LAB — CULTURE, BLOOD (ROUTINE X 2): Special Requests: ADEQUATE
